# Patient Record
Sex: Female | Born: 1945 | Race: White | Hispanic: No | Smoking: Former smoker
Health system: Southern US, Community
[De-identification: ages and names within clinical notes are randomized; demographics above are authoritative.]

## PROBLEM LIST (undated history)

## (undated) DIAGNOSIS — M069 Rheumatoid arthritis, unspecified: Secondary | ICD-10-CM

## (undated) DIAGNOSIS — R42 Dizziness and giddiness: Secondary | ICD-10-CM

## (undated) DIAGNOSIS — D649 Anemia, unspecified: Secondary | ICD-10-CM

## (undated) DIAGNOSIS — K635 Polyp of colon: Secondary | ICD-10-CM

## (undated) DIAGNOSIS — I4891 Unspecified atrial fibrillation: Secondary | ICD-10-CM

## (undated) DIAGNOSIS — R5383 Other fatigue: Secondary | ICD-10-CM

## (undated) DIAGNOSIS — K449 Diaphragmatic hernia without obstruction or gangrene: Secondary | ICD-10-CM

## (undated) DIAGNOSIS — Z6820 Body mass index (BMI) 20.0-20.9, adult: Secondary | ICD-10-CM

## (undated) DIAGNOSIS — R7303 Prediabetes: Secondary | ICD-10-CM

## (undated) DIAGNOSIS — M1990 Unspecified osteoarthritis, unspecified site: Secondary | ICD-10-CM

## (undated) DIAGNOSIS — K649 Unspecified hemorrhoids: Secondary | ICD-10-CM

## (undated) HISTORY — PX: COLONOSCOPY: SHX174

## (undated) HISTORY — PX: SKIN BIOPSY: SHX1

## (undated) HISTORY — DX: Other fatigue: R53.83

## (undated) HISTORY — DX: Body mass index (BMI) 20.0-20.9, adult: Z68.20

## (undated) HISTORY — DX: Dizziness and giddiness: R42

## (undated) HISTORY — PX: FACIAL COSMETIC SURGERY: SHX629

## (undated) HISTORY — DX: Unspecified hemorrhoids: K64.9

## (undated) HISTORY — DX: Diaphragmatic hernia without obstruction or gangrene: K44.9

## (undated) HISTORY — PX: ESOPHAGOGASTRODUODENOSCOPY ENDOSCOPY: SHX5814

## (undated) HISTORY — DX: Polyp of colon: K63.5

## (undated) HISTORY — PX: CATARACT EXTRACTION, BILATERAL: SHX1313

---

## 2013-10-31 DIAGNOSIS — D229 Melanocytic nevi, unspecified: Secondary | ICD-10-CM

## 2013-10-31 HISTORY — DX: Melanocytic nevi, unspecified: D22.9

## 2019-02-10 DIAGNOSIS — R05 Cough: Secondary | ICD-10-CM | POA: Diagnosis not present

## 2019-02-10 DIAGNOSIS — R309 Painful micturition, unspecified: Secondary | ICD-10-CM | POA: Diagnosis not present

## 2019-03-17 DIAGNOSIS — E782 Mixed hyperlipidemia: Secondary | ICD-10-CM | POA: Diagnosis not present

## 2019-03-17 DIAGNOSIS — R69 Illness, unspecified: Secondary | ICD-10-CM | POA: Diagnosis not present

## 2019-04-17 DIAGNOSIS — R69 Illness, unspecified: Secondary | ICD-10-CM | POA: Diagnosis not present

## 2019-04-27 ENCOUNTER — Ambulatory Visit: Payer: Medicare HMO | Attending: Internal Medicine

## 2019-04-27 DIAGNOSIS — Z23 Encounter for immunization: Secondary | ICD-10-CM | POA: Insufficient documentation

## 2019-04-27 NOTE — Progress Notes (Signed)
Covid-19 Vaccination Clinic  Name:  Taesha Rochell    MRN: [id number] DOB: [birth year]  04/27/2019  Ms. Pamplona was observed post Covid-19 immunization for 15 minutes without incidence. She was provided with Vaccine Information Sheet and instruction to access the V-Safe system.   Ms. Gridley was instructed to call 911 with any severe reactions post vaccine: Marland Kitchen Difficulty breathing  . Swelling of your face and throat  . A fast heartbeat  . A bad rash all over your body  . Dizziness and weakness    Immunizations Administered    Name Date Dose VIS Date Route   Pfizer COVID-19 Vaccine 04/27/2019  9:01 AM 0.3 mL 03/17/2019 Intramuscular   Manufacturer: [employer], [employer]   Lot: [id number]   NDC: [address]-0

## 2019-05-18 ENCOUNTER — Ambulatory Visit: Payer: Medicare HMO | Attending: Internal Medicine

## 2019-05-18 DIAGNOSIS — Z23 Encounter for immunization: Secondary | ICD-10-CM | POA: Insufficient documentation

## 2019-05-18 NOTE — Progress Notes (Signed)
Covid-19 Vaccination Clinic  Name:  Natalie Bradshaw    MRN: [id number] DOB: 1946-01-27  05/18/2019  Natalie Bradshaw was observed post Covid-19 immunization for 15 minutes without incidence. She was provided with Vaccine Information Sheet and instruction to access the V-Safe system.   Natalie Bradshaw was instructed to call 911 with any severe reactions post vaccine: Marland Kitchen Difficulty breathing  . Swelling of your face and throat  . A fast heartbeat  . A bad rash all over your body  . Dizziness and weakness    Immunizations Administered    Name Date Dose VIS Date Route   Pfizer COVID-19 Vaccine 05/18/2019  8:44 AM 0.3 mL 03/17/2019 Intramuscular   Manufacturer: [employer], [employer]   Lot: [id number]   NDC: [address]-2

## 2019-05-29 DIAGNOSIS — H40012 Open angle with borderline findings, low risk, left eye: Secondary | ICD-10-CM | POA: Diagnosis not present

## 2019-05-29 DIAGNOSIS — H2513 Age-related nuclear cataract, bilateral: Secondary | ICD-10-CM | POA: Diagnosis not present

## 2019-06-01 DIAGNOSIS — R69 Illness, unspecified: Secondary | ICD-10-CM | POA: Diagnosis not present

## 2019-06-12 DIAGNOSIS — H25043 Posterior subcapsular polar age-related cataract, bilateral: Secondary | ICD-10-CM | POA: Diagnosis not present

## 2019-06-12 DIAGNOSIS — H25013 Cortical age-related cataract, bilateral: Secondary | ICD-10-CM | POA: Diagnosis not present

## 2019-06-12 DIAGNOSIS — H2511 Age-related nuclear cataract, right eye: Secondary | ICD-10-CM | POA: Diagnosis not present

## 2019-06-12 DIAGNOSIS — H2513 Age-related nuclear cataract, bilateral: Secondary | ICD-10-CM | POA: Diagnosis not present

## 2019-06-14 DIAGNOSIS — N898 Other specified noninflammatory disorders of vagina: Secondary | ICD-10-CM | POA: Diagnosis not present

## 2019-06-14 DIAGNOSIS — N950 Postmenopausal bleeding: Secondary | ICD-10-CM | POA: Diagnosis not present

## 2019-06-14 DIAGNOSIS — R102 Pelvic and perineal pain: Secondary | ICD-10-CM | POA: Diagnosis not present

## 2019-06-14 DIAGNOSIS — R3915 Urgency of urination: Secondary | ICD-10-CM | POA: Diagnosis not present

## 2019-06-14 DIAGNOSIS — R351 Nocturia: Secondary | ICD-10-CM | POA: Diagnosis not present

## 2019-06-20 ENCOUNTER — Encounter: Payer: Self-pay | Admitting: Physician Assistant

## 2019-06-20 ENCOUNTER — Ambulatory Visit: Payer: Medicare HMO | Admitting: Physician Assistant

## 2019-06-20 ENCOUNTER — Other Ambulatory Visit: Payer: Self-pay

## 2019-06-20 DIAGNOSIS — L570 Actinic keratosis: Secondary | ICD-10-CM | POA: Diagnosis not present

## 2019-06-20 DIAGNOSIS — Z1283 Encounter for screening for malignant neoplasm of skin: Secondary | ICD-10-CM | POA: Diagnosis not present

## 2019-06-20 DIAGNOSIS — D485 Neoplasm of uncertain behavior of skin: Secondary | ICD-10-CM | POA: Diagnosis not present

## 2019-06-20 DIAGNOSIS — Z85828 Personal history of other malignant neoplasm of skin: Secondary | ICD-10-CM | POA: Diagnosis not present

## 2019-06-20 DIAGNOSIS — D489 Neoplasm of uncertain behavior, unspecified: Secondary | ICD-10-CM

## 2019-06-20 NOTE — Progress Notes (Signed)
   New Patient Visit  Subjective  Natalie Bradshaw is a 74 y.o. female who presents for the following: Annual Exam (pt has histoy of skin cancer previous derm in [place]. pt does fluorouracil tx every year on chest and face.  ).  Objective  Well appearing patient in no apparent distress; mood and affect are within normal limits.  A full examination was performed including scalp, head, eyes, ears, nose, lips, neck, chest, axillae, abdomen, back, buttocks, bilateral upper extremities, bilateral lower extremities, hands, feet, fingers, toes, fingernails, and toenails. All findings within normal limits unless otherwise noted below.  Objective  Left Thigh - Posterior: Erythematous patches with gritty scale.  Objective  Dorsum of Nose: Divot in erythematous base.  Objective  Left chest: Bichromic macule.  Assessment & Plan  AK (actinic keratosis) Left Thigh - Posterior  Destruction of lesion - Left Thigh - Posterior Complexity: simple   Destruction method: cryotherapy   Informed consent: discussed and consent obtained   Timeout:  patient name, date of birth, surgical site, and procedure verified Lesion destroyed using liquid nitrogen: Yes   Cryotherapy cycles:  3 Hemostasis achieved with:  Gelfoam Outcome: patient tolerated procedure well with no complications   Post-procedure details: wound care instructions given    Neoplasm of uncertain behavior (2) Dorsum of Nose  Left chest  Punch biopsy at a later date due to pending surgery next week

## 2019-06-20 NOTE — Patient Instructions (Signed)

## 2019-06-22 DIAGNOSIS — R102 Pelvic and perineal pain: Secondary | ICD-10-CM | POA: Diagnosis not present

## 2019-06-22 DIAGNOSIS — N950 Postmenopausal bleeding: Secondary | ICD-10-CM | POA: Diagnosis not present

## 2019-06-22 DIAGNOSIS — N83202 Unspecified ovarian cyst, left side: Secondary | ICD-10-CM | POA: Diagnosis not present

## 2019-06-27 DIAGNOSIS — H25042 Posterior subcapsular polar age-related cataract, left eye: Secondary | ICD-10-CM | POA: Diagnosis not present

## 2019-06-27 DIAGNOSIS — H25811 Combined forms of age-related cataract, right eye: Secondary | ICD-10-CM | POA: Diagnosis not present

## 2019-06-27 DIAGNOSIS — H25012 Cortical age-related cataract, left eye: Secondary | ICD-10-CM | POA: Diagnosis not present

## 2019-06-27 DIAGNOSIS — H2511 Age-related nuclear cataract, right eye: Secondary | ICD-10-CM | POA: Diagnosis not present

## 2019-06-27 DIAGNOSIS — H2512 Age-related nuclear cataract, left eye: Secondary | ICD-10-CM | POA: Diagnosis not present

## 2019-06-27 DIAGNOSIS — H2513 Age-related nuclear cataract, bilateral: Secondary | ICD-10-CM | POA: Diagnosis not present

## 2019-07-11 DIAGNOSIS — H2512 Age-related nuclear cataract, left eye: Secondary | ICD-10-CM | POA: Diagnosis not present

## 2019-07-11 DIAGNOSIS — H2513 Age-related nuclear cataract, bilateral: Secondary | ICD-10-CM | POA: Diagnosis not present

## 2019-07-17 DIAGNOSIS — R600 Localized edema: Secondary | ICD-10-CM | POA: Diagnosis not present

## 2019-07-17 DIAGNOSIS — M2550 Pain in unspecified joint: Secondary | ICD-10-CM | POA: Diagnosis not present

## 2019-07-28 ENCOUNTER — Ambulatory Visit: Payer: Medicare HMO | Admitting: Physician Assistant

## 2019-08-03 ENCOUNTER — Encounter: Payer: Self-pay | Admitting: *Deleted

## 2019-08-09 DIAGNOSIS — R69 Illness, unspecified: Secondary | ICD-10-CM | POA: Diagnosis not present

## 2019-08-09 DIAGNOSIS — M2550 Pain in unspecified joint: Secondary | ICD-10-CM | POA: Diagnosis not present

## 2019-08-09 DIAGNOSIS — M1990 Unspecified osteoarthritis, unspecified site: Secondary | ICD-10-CM | POA: Diagnosis not present

## 2019-08-09 DIAGNOSIS — E663 Overweight: Secondary | ICD-10-CM | POA: Diagnosis not present

## 2019-08-09 DIAGNOSIS — M7989 Other specified soft tissue disorders: Secondary | ICD-10-CM | POA: Diagnosis not present

## 2019-08-09 DIAGNOSIS — Z6827 Body mass index (BMI) 27.0-27.9, adult: Secondary | ICD-10-CM | POA: Diagnosis not present

## 2019-08-16 DIAGNOSIS — M06041 Rheumatoid arthritis without rheumatoid factor, right hand: Secondary | ICD-10-CM | POA: Diagnosis not present

## 2019-08-16 DIAGNOSIS — M1990 Unspecified osteoarthritis, unspecified site: Secondary | ICD-10-CM | POA: Diagnosis not present

## 2019-08-16 DIAGNOSIS — Z6827 Body mass index (BMI) 27.0-27.9, adult: Secondary | ICD-10-CM | POA: Diagnosis not present

## 2019-08-16 DIAGNOSIS — E663 Overweight: Secondary | ICD-10-CM | POA: Diagnosis not present

## 2019-08-16 DIAGNOSIS — R69 Illness, unspecified: Secondary | ICD-10-CM | POA: Diagnosis not present

## 2019-08-16 DIAGNOSIS — M06042 Rheumatoid arthritis without rheumatoid factor, left hand: Secondary | ICD-10-CM | POA: Diagnosis not present

## 2019-08-16 DIAGNOSIS — M2550 Pain in unspecified joint: Secondary | ICD-10-CM | POA: Diagnosis not present

## 2019-08-16 DIAGNOSIS — M7989 Other specified soft tissue disorders: Secondary | ICD-10-CM | POA: Diagnosis not present

## 2019-08-22 DIAGNOSIS — M0520 Rheumatoid vasculitis with rheumatoid arthritis of unspecified site: Secondary | ICD-10-CM | POA: Diagnosis not present

## 2019-08-22 DIAGNOSIS — R350 Frequency of micturition: Secondary | ICD-10-CM | POA: Diagnosis not present

## 2019-08-24 ENCOUNTER — Ambulatory Visit: Payer: Medicare HMO | Admitting: Physician Assistant

## 2019-09-18 DIAGNOSIS — M5431 Sciatica, right side: Secondary | ICD-10-CM | POA: Diagnosis not present

## 2019-09-18 DIAGNOSIS — M9903 Segmental and somatic dysfunction of lumbar region: Secondary | ICD-10-CM | POA: Diagnosis not present

## 2019-09-19 DIAGNOSIS — M9903 Segmental and somatic dysfunction of lumbar region: Secondary | ICD-10-CM | POA: Diagnosis not present

## 2019-09-19 DIAGNOSIS — M5431 Sciatica, right side: Secondary | ICD-10-CM | POA: Diagnosis not present

## 2019-09-20 DIAGNOSIS — M9903 Segmental and somatic dysfunction of lumbar region: Secondary | ICD-10-CM | POA: Diagnosis not present

## 2019-09-20 DIAGNOSIS — M5431 Sciatica, right side: Secondary | ICD-10-CM | POA: Diagnosis not present

## 2019-09-21 DIAGNOSIS — M5431 Sciatica, right side: Secondary | ICD-10-CM | POA: Diagnosis not present

## 2019-09-21 DIAGNOSIS — M9903 Segmental and somatic dysfunction of lumbar region: Secondary | ICD-10-CM | POA: Diagnosis not present

## 2019-09-25 DIAGNOSIS — M5431 Sciatica, right side: Secondary | ICD-10-CM | POA: Diagnosis not present

## 2019-09-25 DIAGNOSIS — M9903 Segmental and somatic dysfunction of lumbar region: Secondary | ICD-10-CM | POA: Diagnosis not present

## 2019-09-27 DIAGNOSIS — M5431 Sciatica, right side: Secondary | ICD-10-CM | POA: Diagnosis not present

## 2019-09-27 DIAGNOSIS — M9903 Segmental and somatic dysfunction of lumbar region: Secondary | ICD-10-CM | POA: Diagnosis not present

## 2019-10-02 DIAGNOSIS — M9903 Segmental and somatic dysfunction of lumbar region: Secondary | ICD-10-CM | POA: Diagnosis not present

## 2019-10-02 DIAGNOSIS — M5431 Sciatica, right side: Secondary | ICD-10-CM | POA: Diagnosis not present

## 2019-10-05 DIAGNOSIS — R69 Illness, unspecified: Secondary | ICD-10-CM | POA: Diagnosis not present

## 2019-10-05 DIAGNOSIS — M5431 Sciatica, right side: Secondary | ICD-10-CM | POA: Diagnosis not present

## 2019-10-05 DIAGNOSIS — M9903 Segmental and somatic dysfunction of lumbar region: Secondary | ICD-10-CM | POA: Diagnosis not present

## 2019-10-09 DIAGNOSIS — M9903 Segmental and somatic dysfunction of lumbar region: Secondary | ICD-10-CM | POA: Diagnosis not present

## 2019-10-09 DIAGNOSIS — M5431 Sciatica, right side: Secondary | ICD-10-CM | POA: Diagnosis not present

## 2019-10-23 DIAGNOSIS — M9903 Segmental and somatic dysfunction of lumbar region: Secondary | ICD-10-CM | POA: Diagnosis not present

## 2019-10-23 DIAGNOSIS — M5431 Sciatica, right side: Secondary | ICD-10-CM | POA: Diagnosis not present

## 2019-10-25 DIAGNOSIS — M06042 Rheumatoid arthritis without rheumatoid factor, left hand: Secondary | ICD-10-CM | POA: Diagnosis not present

## 2019-10-25 DIAGNOSIS — R69 Illness, unspecified: Secondary | ICD-10-CM | POA: Diagnosis not present

## 2019-10-25 DIAGNOSIS — M06041 Rheumatoid arthritis without rheumatoid factor, right hand: Secondary | ICD-10-CM | POA: Diagnosis not present

## 2019-10-25 DIAGNOSIS — E663 Overweight: Secondary | ICD-10-CM | POA: Diagnosis not present

## 2019-10-25 DIAGNOSIS — M2550 Pain in unspecified joint: Secondary | ICD-10-CM | POA: Diagnosis not present

## 2019-10-25 DIAGNOSIS — M1990 Unspecified osteoarthritis, unspecified site: Secondary | ICD-10-CM | POA: Diagnosis not present

## 2019-10-25 DIAGNOSIS — Z6826 Body mass index (BMI) 26.0-26.9, adult: Secondary | ICD-10-CM | POA: Diagnosis not present

## 2019-10-25 DIAGNOSIS — M7989 Other specified soft tissue disorders: Secondary | ICD-10-CM | POA: Diagnosis not present

## 2019-11-08 DIAGNOSIS — M9903 Segmental and somatic dysfunction of lumbar region: Secondary | ICD-10-CM | POA: Diagnosis not present

## 2019-11-08 DIAGNOSIS — M5431 Sciatica, right side: Secondary | ICD-10-CM | POA: Diagnosis not present

## 2019-11-10 ENCOUNTER — Ambulatory Visit: Payer: Medicare HMO | Admitting: Physician Assistant

## 2019-11-16 DIAGNOSIS — Z5181 Encounter for therapeutic drug level monitoring: Secondary | ICD-10-CM | POA: Diagnosis not present

## 2019-11-16 DIAGNOSIS — E2839 Other primary ovarian failure: Secondary | ICD-10-CM | POA: Diagnosis not present

## 2019-11-16 DIAGNOSIS — E782 Mixed hyperlipidemia: Secondary | ICD-10-CM | POA: Diagnosis not present

## 2019-11-16 DIAGNOSIS — R69 Illness, unspecified: Secondary | ICD-10-CM | POA: Diagnosis not present

## 2019-11-16 DIAGNOSIS — R7301 Impaired fasting glucose: Secondary | ICD-10-CM | POA: Diagnosis not present

## 2019-11-16 DIAGNOSIS — Z1211 Encounter for screening for malignant neoplasm of colon: Secondary | ICD-10-CM | POA: Diagnosis not present

## 2019-11-16 DIAGNOSIS — Z0000 Encounter for general adult medical examination without abnormal findings: Secondary | ICD-10-CM | POA: Diagnosis not present

## 2019-11-17 ENCOUNTER — Other Ambulatory Visit: Payer: Self-pay | Admitting: Family Medicine

## 2019-11-17 DIAGNOSIS — E2839 Other primary ovarian failure: Secondary | ICD-10-CM

## 2019-11-21 DIAGNOSIS — M5431 Sciatica, right side: Secondary | ICD-10-CM | POA: Diagnosis not present

## 2019-11-21 DIAGNOSIS — M9903 Segmental and somatic dysfunction of lumbar region: Secondary | ICD-10-CM | POA: Diagnosis not present

## 2019-11-29 DIAGNOSIS — Z1211 Encounter for screening for malignant neoplasm of colon: Secondary | ICD-10-CM | POA: Diagnosis not present

## 2019-12-19 DIAGNOSIS — M9903 Segmental and somatic dysfunction of lumbar region: Secondary | ICD-10-CM | POA: Diagnosis not present

## 2019-12-19 DIAGNOSIS — M5431 Sciatica, right side: Secondary | ICD-10-CM | POA: Diagnosis not present

## 2019-12-20 DIAGNOSIS — N83202 Unspecified ovarian cyst, left side: Secondary | ICD-10-CM | POA: Diagnosis not present

## 2019-12-26 DIAGNOSIS — M7989 Other specified soft tissue disorders: Secondary | ICD-10-CM | POA: Diagnosis not present

## 2019-12-26 DIAGNOSIS — H40012 Open angle with borderline findings, low risk, left eye: Secondary | ICD-10-CM | POA: Diagnosis not present

## 2019-12-26 DIAGNOSIS — Z6826 Body mass index (BMI) 26.0-26.9, adult: Secondary | ICD-10-CM | POA: Diagnosis not present

## 2019-12-26 DIAGNOSIS — M2550 Pain in unspecified joint: Secondary | ICD-10-CM | POA: Diagnosis not present

## 2019-12-26 DIAGNOSIS — R69 Illness, unspecified: Secondary | ICD-10-CM | POA: Diagnosis not present

## 2019-12-26 DIAGNOSIS — E663 Overweight: Secondary | ICD-10-CM | POA: Diagnosis not present

## 2019-12-26 DIAGNOSIS — M1990 Unspecified osteoarthritis, unspecified site: Secondary | ICD-10-CM | POA: Diagnosis not present

## 2019-12-26 DIAGNOSIS — M06041 Rheumatoid arthritis without rheumatoid factor, right hand: Secondary | ICD-10-CM | POA: Diagnosis not present

## 2019-12-26 DIAGNOSIS — M06042 Rheumatoid arthritis without rheumatoid factor, left hand: Secondary | ICD-10-CM | POA: Diagnosis not present

## 2020-01-03 DIAGNOSIS — M5431 Sciatica, right side: Secondary | ICD-10-CM | POA: Diagnosis not present

## 2020-01-03 DIAGNOSIS — M9903 Segmental and somatic dysfunction of lumbar region: Secondary | ICD-10-CM | POA: Diagnosis not present

## 2020-01-08 DIAGNOSIS — R69 Illness, unspecified: Secondary | ICD-10-CM | POA: Diagnosis not present

## 2020-01-11 DIAGNOSIS — M9903 Segmental and somatic dysfunction of lumbar region: Secondary | ICD-10-CM | POA: Diagnosis not present

## 2020-01-11 DIAGNOSIS — R69 Illness, unspecified: Secondary | ICD-10-CM | POA: Diagnosis not present

## 2020-01-11 DIAGNOSIS — M5431 Sciatica, right side: Secondary | ICD-10-CM | POA: Diagnosis not present

## 2020-01-23 DIAGNOSIS — R69 Illness, unspecified: Secondary | ICD-10-CM | POA: Diagnosis not present

## 2020-01-23 DIAGNOSIS — E782 Mixed hyperlipidemia: Secondary | ICD-10-CM | POA: Diagnosis not present

## 2020-01-23 DIAGNOSIS — R2689 Other abnormalities of gait and mobility: Secondary | ICD-10-CM | POA: Diagnosis not present

## 2020-01-23 DIAGNOSIS — F5101 Primary insomnia: Secondary | ICD-10-CM | POA: Diagnosis not present

## 2020-01-31 ENCOUNTER — Ambulatory Visit: Payer: Medicare HMO | Admitting: Physician Assistant

## 2020-01-31 ENCOUNTER — Ambulatory Visit: Payer: Medicare HMO | Admitting: Dermatology

## 2020-01-31 ENCOUNTER — Encounter: Payer: Self-pay | Admitting: Dermatology

## 2020-01-31 ENCOUNTER — Other Ambulatory Visit: Payer: Self-pay

## 2020-01-31 DIAGNOSIS — L570 Actinic keratosis: Secondary | ICD-10-CM

## 2020-01-31 DIAGNOSIS — C44311 Basal cell carcinoma of skin of nose: Secondary | ICD-10-CM | POA: Diagnosis not present

## 2020-01-31 DIAGNOSIS — B079 Viral wart, unspecified: Secondary | ICD-10-CM

## 2020-01-31 DIAGNOSIS — D485 Neoplasm of uncertain behavior of skin: Secondary | ICD-10-CM

## 2020-01-31 DIAGNOSIS — L988 Other specified disorders of the skin and subcutaneous tissue: Secondary | ICD-10-CM

## 2020-01-31 DIAGNOSIS — C4491 Basal cell carcinoma of skin, unspecified: Secondary | ICD-10-CM

## 2020-01-31 HISTORY — DX: Basal cell carcinoma of skin, unspecified: C44.91

## 2020-01-31 MED ORDER — TRETINOIN 0.1 % EX CREA: TOPICAL_CREAM | Freq: Every evening | CUTANEOUS | 3 refills | Status: AC

## 2020-01-31 NOTE — Patient Instructions (Signed)

## 2020-02-01 ENCOUNTER — Telehealth: Payer: Self-pay

## 2020-02-01 DIAGNOSIS — M9903 Segmental and somatic dysfunction of lumbar region: Secondary | ICD-10-CM | POA: Diagnosis not present

## 2020-02-01 DIAGNOSIS — M5431 Sciatica, right side: Secondary | ICD-10-CM | POA: Diagnosis not present

## 2020-02-05 DIAGNOSIS — R69 Illness, unspecified: Secondary | ICD-10-CM | POA: Diagnosis not present

## 2020-02-08 ENCOUNTER — Telehealth: Payer: Self-pay

## 2020-02-08 NOTE — Telephone Encounter (Signed)
-----   Message from Warren Danes, [place] sent at 02/08/2020 12:58 PM EDT ----- Pt needs 30 minute surgery

## 2020-02-08 NOTE — Telephone Encounter (Signed)
Phone call to patient with her pathology results. Voicemail left for patient to give the office a call back.  

## 2020-02-08 NOTE — Progress Notes (Addendum)
I, Lavonna Monarch, MD, have reviewed all documentation for this visit. The documentation on 11/03/20 for the exam, diagnosis, procedures, and orders are all accurate and complete. I, Lavonna Monarch, MD, have reviewed all documentation for this visit. The documentation on 11/03/20 for the exam, diagnosis, procedures, and orders are all accurate and complete.   Follow-Up Visit   Subjective  Natalie Bradshaw is a 74 y.o. female who presents for the following: Skin Problem (tip of nose- biopsy- deffered last office visit).   The following portions of the chart were reviewed this encounter and updated as appropriate:     Objective  Well appearing patient in no apparent distress; mood and affect are within normal limits.  All skin waist up examined.  Objective  Left Lower Leg - Anterior, Left Lower Leg - Posterior, Right Lower Leg - Posterior: Verrucous papules -- Discussed viral etiology and contagion.   Objective  Mid Tip of Nose: Pink raised papule      Assessment & Plan  Viral warts, unspecified type (3) Left Lower Leg - Anterior; Left Lower Leg - Posterior; Right Lower Leg - Posterior  Destruction of lesion - Left Lower Leg - Anterior, Left Lower Leg - Posterior, Right Lower Leg - Posterior Complexity: simple   Destruction method: cryotherapy   Informed consent: discussed and consent obtained   Timeout:  patient name, date of birth, surgical site, and procedure verified Lesion destroyed using liquid nitrogen: Yes   Cryotherapy cycles:  5 Outcome: patient tolerated procedure well with no complications    Wrinkles  Ordered Medications: tretinoin (RETIN-A) 0.1 % cream  AK (actinic keratosis)  Ordered Medications: tretinoin (RETIN-A) 0.1 % cream  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 Mid Tip of Nose  Skin / nail biopsy Type of biopsy: tangential   Informed consent: discussed and consent obtained   Timeout: patient name, date of birth, surgical site, and  procedure verified   Procedure prep:  Patient was prepped and draped in usual sterile fashion (Non sterile) Prep type:  Chlorhexidine Anesthesia: the lesion was anesthetized in a standard fashion   Anesthetic:  1% lidocaine w/ epinephrine 1-100,000 local infiltration Instrument used: flexible razor blade   Outcome: patient tolerated procedure well   Post-procedure details: wound care instructions given    Specimen 1 - Surgical pathology Differential Diagnosis: bcc vs scc Check Margins: No    I, Karel Mowers, PA-C, have reviewed all documentation's for this visit.  The documentation on 02/08/20 for the exam, diagnosis, procedures and orders are all accurate and complete.

## 2020-02-13 ENCOUNTER — Telehealth: Payer: Self-pay | Admitting: Dermatology

## 2020-02-13 NOTE — Telephone Encounter (Signed)
Results. Can leave message

## 2020-02-13 NOTE — Telephone Encounter (Signed)
Phone call to patient with her pathology results voicemail left for patient to give the office a call back.  °

## 2020-02-14 NOTE — Telephone Encounter (Signed)
Pathology to patient, 30 minute surgery scheduled.  

## 2020-02-15 DIAGNOSIS — M9903 Segmental and somatic dysfunction of lumbar region: Secondary | ICD-10-CM | POA: Diagnosis not present

## 2020-02-15 DIAGNOSIS — M5431 Sciatica, right side: Secondary | ICD-10-CM | POA: Diagnosis not present

## 2020-02-22 ENCOUNTER — Other Ambulatory Visit: Payer: Self-pay

## 2020-02-22 ENCOUNTER — Ambulatory Visit
Admission: RE | Admit: 2020-02-22 | Discharge: 2020-02-22 | Disposition: A | Discharge status: Home / Self Care | Payer: Medicare HMO | Source: Ambulatory Visit | Attending: Family Medicine | Admitting: Family Medicine

## 2020-02-22 DIAGNOSIS — Z780 Asymptomatic menopausal state: Secondary | ICD-10-CM | POA: Diagnosis not present

## 2020-02-22 DIAGNOSIS — E2839 Other primary ovarian failure: Secondary | ICD-10-CM

## 2020-02-27 DIAGNOSIS — Z6828 Body mass index (BMI) 28.0-28.9, adult: Secondary | ICD-10-CM | POA: Diagnosis not present

## 2020-02-27 DIAGNOSIS — M06042 Rheumatoid arthritis without rheumatoid factor, left hand: Secondary | ICD-10-CM | POA: Diagnosis not present

## 2020-02-27 DIAGNOSIS — E663 Overweight: Secondary | ICD-10-CM | POA: Diagnosis not present

## 2020-02-27 DIAGNOSIS — M1990 Unspecified osteoarthritis, unspecified site: Secondary | ICD-10-CM | POA: Diagnosis not present

## 2020-02-27 DIAGNOSIS — M7989 Other specified soft tissue disorders: Secondary | ICD-10-CM | POA: Diagnosis not present

## 2020-02-27 DIAGNOSIS — M06041 Rheumatoid arthritis without rheumatoid factor, right hand: Secondary | ICD-10-CM | POA: Diagnosis not present

## 2020-02-27 DIAGNOSIS — M2550 Pain in unspecified joint: Secondary | ICD-10-CM | POA: Diagnosis not present

## 2020-02-27 DIAGNOSIS — R69 Illness, unspecified: Secondary | ICD-10-CM | POA: Diagnosis not present

## 2020-03-13 ENCOUNTER — Encounter: Payer: Self-pay | Admitting: Physical Therapy

## 2020-03-13 ENCOUNTER — Ambulatory Visit: Payer: Medicare HMO | Attending: Family Medicine | Admitting: Physical Therapy

## 2020-03-13 ENCOUNTER — Other Ambulatory Visit: Payer: Self-pay

## 2020-03-13 DIAGNOSIS — R278 Other lack of coordination: Secondary | ICD-10-CM | POA: Diagnosis not present

## 2020-03-13 NOTE — Therapy (Signed)
[facility] [address] [place], [place], [address] Phone: [phone]   Fax:  [phone]  Physical Therapy Evaluation  Patient Details  Name: Natalie Bradshaw MRN: [id number] Date of Birth: 1945/07/29 Referring Provider (PT): R26.89 (ICD-10-CM) - Balance problems   Encounter Date: 03/13/2020   PT End of Session - 03/13/20 1054    Visit Number 1    Number of Visits 1    Authorization Type [employer] Medicare    PT Start Time 1017    PT Stop Time 1050    PT Time Calculation (min) 33 min    Activity Tolerance Patient tolerated treatment well    Behavior During Therapy [facility] for tasks assessed/performed           Past Medical History:  Diagnosis Date  . Atypical mole 10/31/2013   moderate- midline,chest-margins clear  . Superficial basal cell carcinoma (BCC) 01/31/2020   Mid tip of nose    Past Surgical History:  Procedure Laterality Date  . SKIN BIOPSY      There were no vitals filed for this visit.    Subjective Assessment - 03/13/20 1015    Subjective Pt referred to OPPT for balance problems.  Pt reports she lost her son suddenly approx 7 mos ago and has had signif grief since then.  She has increased light-headedness and anxiety since losing her son.  She feels as though she will pass out due to anxiety when around too much stimulus.  She has increased anxiety when out in the community.    Pertinent History negative bone density    How long can you walk comfortably? unlimited    Patient Stated Goals feel better without anxiety pills    Currently in Pain? No/denies              [facility] PT Assessment - 03/13/20 0001      Assessment   Medical Diagnosis Glenis Smoker, MD    Referring Provider (PT) R26.89 (ICD-10-CM) - Balance problems    Onset Date/Surgical Date --   7 mos ago after losing her son   Hand Dominance Right    Next MD Visit next week    Prior Therapy no      Precautions    Precautions None      Restrictions   Weight Bearing Restrictions No      Balance Screen   Has the patient fallen in the past 6 months No    Has the patient had a decrease in activity level because of a fear of falling?  No    Is the patient reluctant to leave their home because of a fear of falling?  No      Prior Function   Level of Independence Independent      Cognition   Overall Cognitive Status Within Functional Limits for tasks assessed      Functional Tests   Functional tests Squat;Single leg stance      Squat   Comments ind      Single Leg Stance   Comments able to safely balance on Rt/Lt LE x 10 sec      ROM / Strength   AROM / PROM / Strength AROM;Strength      AROM   Overall AROM Comments trunk and LEs WNL      Strength   Overall Strength Comments 5/5 bil LEs      Ambulation/Gait   Gait Comments gait pattern WNL  for trunk and LEs, and scored full scores on both Berg Balance Scale and Dynamic Gait Index demonstrating no/low fall  risk.  PT discussed importance of returning to walking and yoga and seeking some grief counseling to help her cope with the loss of her son given its impact on her physical state and tolerance of activity.  PT gave info on vagus nerve stimulation activities including meditation, yoga, and walking.  PT and Pt agreed that she could seek her physical activities previously enjoyed independently and did not need follow up PT visits, especially given no fall risk found with evaluation.    Personal Factors and Comorbidities Comorbidity 1    Comorbidities grief over sudden loss of son    Examination-Participation Restrictions Community Activity    Stability/Clinical Decision Making Stable/Uncomplicated    Clinical Decision Making Low    Rehab Potential Excellent    PT Frequency One time visit    PT Duration Other (comment)   evaluation only   PT Treatment/Interventions Patient/family education    PT Next Visit Plan eval only with self-care instructed at evaluation    PT Home Exercise Plan return to walking, yoga, other stress management techniques previously enjoyed, know your triggers for stress/anxiety and try brief exposures as tol    Consulted and Agree with Plan of Care Patient           Patient will benefit from skilled therapeutic intervention in order to improve the following deficits and impairments:     Visit Diagnosis: Other lack of coordination - Plan: PT plan of care cert/re-cert     Problem List There are no problems to display for this patient.   Venetia Night Sanora Cunanan, PT 03/13/20 12:51 PM   [facility] [address] [place], [place], [address] Phone: [phone]   Fax:  [phone]  Name: Natalie Bradshaw MRN: [id number] Date of Birth: 1946-02-15  [facility] [address] [place], [place], [address] Phone: [phone]   Fax:  [phone]  Physical Therapy Evaluation  Patient Details  Name: Natalie Bradshaw MRN: [id number] Date of Birth: 1945/07/29 Referring Provider (PT): R26.89 (ICD-10-CM) - Balance problems   Encounter Date: 03/13/2020   PT End of Session - 03/13/20 1054    Visit Number 1    Number of Visits 1    Authorization Type A[employer]na Medicare    PT Start Time 1017    PT Stop Time 1050    PT Time Calculation (min) 33 min    Activity Tolerance Patient tolerated treatment well    Behavior During Therapy [facility] for tasks assessed/performed           Past Medical History:  Diagnosis Date  . Atypical mole 10/31/2013   moderate- midline,chest-margins clear  . Superficial basal cell carcinoma (BCC) 01/31/2020   Mid tip of nose    Past Surgical History:  Procedure Laterality Date  . SKIN BIOPSY      There were no vitals filed for this visit.    Subjective Assessment - 03/13/20 1015    Subjective Pt referred to OPPT for balance problems.  Pt reports she lost her son suddenly approx 7 mos ago and has had signif grief since then.  She has increased light-headedness and anxiety since losing her son.  She feels as though she will pass out due to anxiety when around too much stimulus.  She has increased anxiety when out in the community.    Pertinent History negative bone density    How long can you walk comfortably? unlimited    Patient Stated Goals feel better without anxiety pills    Currently in Pain? No/denies              [facility] PT Assessment - 03/13/20 0001      Assessment   Medical Diagnosis Glenis Smoker, MD    Referring Provider (PT) R26.89 (ICD-10-CM) - Balance problems    Onset Date/Surgical Date --   7 mos ago after losing her son   Hand Dominance Right    Next MD Visit next week    Prior Therapy no      Precautions    Precautions None      Restrictions   Weight Bearing Restrictions No      Balance Screen   Has the patient fallen in the past 6 months No    Has the patient had a decrease in activity level because of a fear of falling?  No    Is the patient reluctant to leave their home because of a fear of falling?  No      Prior Function   Level of Independence Independent      Cognition   Overall Cognitive Status Within Functional Limits for tasks assessed      Functional Tests   Functional tests Squat;Single leg stance      Squat   Comments ind      Single Leg Stance   Comments able to safely balance on Rt/Lt LE x 10 sec      ROM / Strength   AROM / PROM / Strength AROM;Strength      AROM   Overall AROM Comments trunk and LEs WNL      Strength   Overall Strength Comments 5/5 bil LEs      Ambulation/Gait   Gait Comments gait pattern WNL

## 2020-03-13 NOTE — Patient Instructions (Signed)
Stress Management and Relaxation Techniques There are two divisions in the nervous system that run many of our body's "behind the scenes" functions.  The "fight or flight" nervous system, and the "calming, rest and digest" nervous system.  These two systems have opposite effects on our body organs and systems and can impact our heart rate, blood pressure, breath rate, temperature, GI function, and experience of stress or calm.    Taking time to stimulate the "calming, rest and digest" part of our nervous system can help reduce experience of symptoms of chronic pain conditions, decrease stress and anxiety, and allow us to feel more equipped to handle challenges.  Below are strategies, techniques, and video suggestions to help stimulate this calming system.     Ways to Calm the Nervous System Yoga Meditation Mindfulness  Stretching Exercise Deep, slow breathing into belly (diaphragmatic breathing) with focused prolonged exhale Monotasking vs Multi-tasking (do one activity daily that is simple, focused, and slowly performed) Listen to your biorhythms: sleep when tired, rise when rested, eat when hungry, stop when full, etc Social connections - seek connections with others Laughter - laughing helps stimulate our "calming" nervous system Massage - by a practitioner or self-massage (example, feet for reflexology points) Singing or humming Cold exposure - try 30 sec of cold water at the end of your shower   Meditation, Yoga, Breathing, Stretching Video Suggestions [employer] YouTube Videos: Guided Meditation for Pelvic Floor Relaxation - [employer] YouTube video 10-Min Breath Meditation for Pelvic Health and Healing - [employer] YouTube video Pelvic Floor Release Stretches Bedtime Yoga for Pelvic Tension Pelvic Floor Release and Inner Thigh Stretch - Yoga for Pelvic Health (approx. 40 min) Progressive Muscle Relaxation Exercises - search Edmond Jacobson exercise videos on  YouTube Focused relaxation through guided relaxation, breathing, and contractions/relaxations of various muscle groups Autogenic Relaxation Technique - search videos on YouTube Uses body's natural relaxation response through guided meditation, inducing heaviness in body, and verbal stimuli/affirmations to create overall feeling of well-being, slowed breathing, reduced heart rate, reduced blood pressure, reduced stress/anxiety Sympathetic Breathing Meditation - search videos on YouTube Regulate the nervous system and restore calm through focused breathing to stimulate the parasympathetic nervous system (the opposite of our "fight or flight" sympathetic nervous system) Mindfulness Meditation - search videos on YouTube Focuses on choosing to be present in the moment, finding enjoyment in the now Diaphragmatic Breathing - search videos on YouTube Guided Imagery for Relaxation - search videos on YouTube  

## 2020-03-19 DIAGNOSIS — R69 Illness, unspecified: Secondary | ICD-10-CM | POA: Diagnosis not present

## 2020-03-19 DIAGNOSIS — F5101 Primary insomnia: Secondary | ICD-10-CM | POA: Diagnosis not present

## 2020-03-20 ENCOUNTER — Ambulatory Visit: Payer: Medicare HMO | Admitting: Physical Therapy

## 2020-04-09 DIAGNOSIS — J040 Acute laryngitis: Secondary | ICD-10-CM | POA: Diagnosis not present

## 2020-04-25 ENCOUNTER — Encounter: Payer: Self-pay | Admitting: Dermatology

## 2020-04-25 ENCOUNTER — Other Ambulatory Visit: Payer: Self-pay

## 2020-04-25 ENCOUNTER — Ambulatory Visit (INDEPENDENT_AMBULATORY_CARE_PROVIDER_SITE_OTHER): Payer: Medicare HMO | Admitting: Dermatology

## 2020-04-25 DIAGNOSIS — C44311 Basal cell carcinoma of skin of nose: Secondary | ICD-10-CM | POA: Diagnosis not present

## 2020-04-25 DIAGNOSIS — M71342 Other bursal cyst, left hand: Secondary | ICD-10-CM

## 2020-04-25 DIAGNOSIS — M7130 Other bursal cyst, unspecified site: Secondary | ICD-10-CM

## 2020-04-25 DIAGNOSIS — C4491 Basal cell carcinoma of skin, unspecified: Secondary | ICD-10-CM

## 2020-04-25 NOTE — Patient Instructions (Signed)

## 2020-04-29 ENCOUNTER — Encounter: Payer: Self-pay | Admitting: Dermatology

## 2020-04-29 NOTE — Progress Notes (Signed)
   Follow-Up Visit   Subjective  Natalie Bradshaw is a 75 y.o. female who presents for the following: Procedure (Bcc x1 ).  Superficial BCC Location: Nasal tip Duration:  Quality:  Associated Signs/Symptoms: Modifying Factors:  Severity:  Timing: Context: For treatment  Objective  Well appearing patient in no apparent distress; mood and affect are within normal limits. Objective  Mid Tip of Nose: Biopsy site identified by patient, nurse, and me.  Objective  Left 4th Finger Tip: Solitary, smooth skin colored to translucent papule.    A focused examination was performed including Head and neck.. Relevant physical exam findings are noted in the Assessment and Plan.   Assessment & Plan    Basal cell carcinoma (BCC), unspecified site Mid Tip of Nose  Destruction of lesion Complexity: simple   Destruction method: electrodesiccation and curettage   Informed consent: discussed and consent obtained   Timeout:  patient name, date of birth, surgical site, and procedure verified Anesthesia: the lesion was anesthetized in a standard fashion   Anesthetic:  1% lidocaine w/ epinephrine 1-100,000 local infiltration Curettage performed in three different directions: Yes   Curettage cycles:  3 Lesion length (cm):  1 Lesion width (cm):  1 Margin per side (cm):  0 Final wound size (cm):  1 Hemostasis achieved with:  ferric subsulfate Outcome: patient tolerated procedure well with no complications   Additional details:  Wound innoculated with 5 fluorouracil solution.  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  The documentation on 04/29/20 for the exam, diagnosis, procedures, and orders are all accurate and complete.

## 2020-05-01 ENCOUNTER — Telehealth: Payer: Self-pay

## 2020-05-01 NOTE — Telephone Encounter (Signed)
Phone call to patient's Pharmacy to inform them that we will not do a prior authorization for the patient's Tretinoin Cream.  Patient is using this medication for wrinkles so she will have to pay cash for it.

## 2020-05-01 NOTE — Telephone Encounter (Signed)
Fax received from [facility] for prior authorization for Tretinoin 0.1% Cream.

## 2020-05-06 ENCOUNTER — Telehealth: Payer: Self-pay | Admitting: Dermatology

## 2020-05-06 NOTE — Telephone Encounter (Signed)
Patient calling wanting to know how long to use Vaseline, informed her just a few day.  Patient was worried about getting a scar- told her that was discussed with her per dr tafeen before the treatment was done  Informed her I cant say whether she will have a scar or not, everybody heals different.  Patient informed me that she has a PDT scheduled for 05/20/20: informed her that lesion needed to be healed before PDT.

## 2020-05-06 NOTE — Telephone Encounter (Signed)
1) Asked if she should still put ointment on nose; told instructions were in my chart + she could still use it if she wanted 2)Asked if it was going to heal; "it looks terrible, like someone punched me in the nose. I never would have had it done if I knew it would look like this"  Also, she's scheduled for PDT on 05/20/20; please see where it's being done & if face should she being doing it?

## 2020-05-20 ENCOUNTER — Ambulatory Visit: Payer: Medicare HMO | Admitting: Dermatology

## 2020-05-20 ENCOUNTER — Other Ambulatory Visit: Payer: Self-pay

## 2020-05-20 ENCOUNTER — Encounter: Payer: Self-pay | Admitting: Dermatology

## 2020-05-20 DIAGNOSIS — L570 Actinic keratosis: Secondary | ICD-10-CM

## 2020-05-20 MED ORDER — AMINOLEVULINIC ACID HCL 10 % EX GEL
2000.0000 mg | Freq: Once | CUTANEOUS | Status: AC
  Administered 2020-05-20: 2000 mg via TOPICAL

## 2020-05-20 NOTE — Patient Instructions (Signed)

## 2020-05-20 NOTE — Progress Notes (Signed)
Photodynamic Therapy Procedure Note Diagnosis: Actinic Keratosis Location: face Informed Consent: Discussed risks (burning, pain, redness, peeling, severe sunburn-like reaction, blistering, discoloration, lack of resolution) and benefits of the procedure, as well as the alternatives. Informed consent was obtained. Preparation: After cleansing the skin, the area to be treated was coated with Levulan.  This was allowed to sit on the skin for 1.5 hours. Procedure Details: The patient was placed under the light source with appropriate eye protection for 20 minutes. After completing the treatment, the patient applied sunscreen to the treated areas. Patient tolerated procedure well.  Plan: Avoid any sun exposure for the next 24 hours. Wear sunscreen daily for the next week. Observe normal sun precautions thereafter. Recommend OTC analgesia as needed for pain. Follow-up in 10 weeks .

## 2020-05-21 DIAGNOSIS — R300 Dysuria: Secondary | ICD-10-CM | POA: Diagnosis not present

## 2020-06-01 ENCOUNTER — Encounter: Payer: Self-pay | Admitting: Dermatology

## 2020-06-05 ENCOUNTER — Telehealth: Payer: Self-pay | Admitting: Dermatology

## 2020-06-05 NOTE — Telephone Encounter (Signed)
NOT A  PHONE CALL. Patient walked into office,requested Dr. Denna Haggard sign "Tinted Window Waiver Application Form" from [facility]; said he knew about it. Dr Denna Haggard signed form

## 2020-06-18 DIAGNOSIS — R69 Illness, unspecified: Secondary | ICD-10-CM | POA: Diagnosis not present

## 2020-06-18 DIAGNOSIS — Z6828 Body mass index (BMI) 28.0-28.9, adult: Secondary | ICD-10-CM | POA: Diagnosis not present

## 2020-06-18 DIAGNOSIS — M7989 Other specified soft tissue disorders: Secondary | ICD-10-CM | POA: Diagnosis not present

## 2020-06-18 DIAGNOSIS — M2550 Pain in unspecified joint: Secondary | ICD-10-CM | POA: Diagnosis not present

## 2020-06-18 DIAGNOSIS — M06042 Rheumatoid arthritis without rheumatoid factor, left hand: Secondary | ICD-10-CM | POA: Diagnosis not present

## 2020-06-18 DIAGNOSIS — M06041 Rheumatoid arthritis without rheumatoid factor, right hand: Secondary | ICD-10-CM | POA: Diagnosis not present

## 2020-06-18 DIAGNOSIS — M1990 Unspecified osteoarthritis, unspecified site: Secondary | ICD-10-CM | POA: Diagnosis not present

## 2020-06-18 DIAGNOSIS — E663 Overweight: Secondary | ICD-10-CM | POA: Diagnosis not present

## 2020-08-06 ENCOUNTER — Other Ambulatory Visit: Payer: Self-pay

## 2020-08-06 ENCOUNTER — Encounter: Payer: Self-pay | Admitting: Physician Assistant

## 2020-08-06 ENCOUNTER — Ambulatory Visit: Payer: Medicare HMO | Admitting: Physician Assistant

## 2020-08-06 DIAGNOSIS — Z86018 Personal history of other benign neoplasm: Secondary | ICD-10-CM | POA: Diagnosis not present

## 2020-08-06 DIAGNOSIS — D0472 Carcinoma in situ of skin of left lower limb, including hip: Secondary | ICD-10-CM | POA: Diagnosis not present

## 2020-08-06 DIAGNOSIS — Z1283 Encounter for screening for malignant neoplasm of skin: Secondary | ICD-10-CM | POA: Diagnosis not present

## 2020-08-06 DIAGNOSIS — L219 Seborrheic dermatitis, unspecified: Secondary | ICD-10-CM

## 2020-08-06 DIAGNOSIS — C4492 Squamous cell carcinoma of skin, unspecified: Secondary | ICD-10-CM

## 2020-08-06 DIAGNOSIS — Z85828 Personal history of other malignant neoplasm of skin: Secondary | ICD-10-CM | POA: Diagnosis not present

## 2020-08-06 DIAGNOSIS — D485 Neoplasm of uncertain behavior of skin: Secondary | ICD-10-CM

## 2020-08-06 HISTORY — DX: Squamous cell carcinoma of skin, unspecified: C44.92

## 2020-08-06 MED ORDER — KETOCONAZOLE 2 % EX CREA: 1.0000 "application " | TOPICAL_CREAM | Freq: Two times a day (BID) | CUTANEOUS | 10 refills | Status: AC

## 2020-08-06 MED ORDER — ALCLOMETASONE DIPROPIONATE 0.05 % EX CREA: TOPICAL_CREAM | Freq: Two times a day (BID) | CUTANEOUS | 3 refills | Status: DC | PRN

## 2020-08-06 NOTE — Progress Notes (Deleted)
   Follow-Up Visit   Subjective  Natalie Bradshaw is a 75 y.o. female who presents for the following: Follow-up (Follow up on PDT on face. Had a little reaction but likes cream (tretinoin) better. She doesn't have any new lesions. Patient has new spot left lower leg  x 2 months. Is rough and scaly. Patient has history of BCC and atypical mole.   The following portions of the chart were reviewed this encounter and updated as appropriate:  All ROS are negative.    Objective  Well appearing patient in no apparent distress; mood and affect are within normal limits.  All skin waist up examined.  Objective  Head - Anterior (Face), Left Ear, Right Ear: Erythematous plaques with greasy scale.   Objective  Left Lower Leg - Anterior: Volcano growth on pink base      Assessment & Plan    Neoplasm of uncertain behavior of skin Left Lower Leg - Anterior  Skin / nail biopsy Type of biopsy: tangential   Informed consent: discussed and consent obtained   Timeout: patient name, date of birth, surgical site, and procedure verified   Anesthesia: the lesion was anesthetized in a standard fashion   Anesthetic:  1% lidocaine w/ epinephrine 1-100,000 local infiltration Instrument used: flexible razor blade   Hemostasis achieved with: ferric subsulfate   Outcome: patient tolerated procedure well   Post-procedure details: wound care instructions given    Specimen 1 - Surgical pathology Differential Diagnosis: scc vs bcc vs atypia  Check Margins: No Lentigines - Scattered tan macules - Discussed due to sun exposure - Benign, observe - Call for any changes  Seborrheic Keratoses - Stuck-on, waxy, tan-brown papules and plaques  - Discussed benign etiology and prognosis. - Observe - Call for any changes  Melanocytic Nevi - Tan-brown and/or pink-flesh-colored symmetric macules and papules - Benign appearing on exam today - Observation - Call clinic for new or changing moles - Recommend  daily use of broad spectrum spf 30+ sunscreen to sun-exposed areas.   Hemangiomas - Red papules - Discussed benign nature - Observe - Call for any changes  Actinic Damage - diffuse scaly erythematous macules with underlying dyspigmentation - Recommend daily broad spectrum sunscreen SPF 30+ to sun-exposed areas, reapply every 2 hours as needed.  - Call for new or changing lesions.  Skin cancer screening performed today.   I, Breindy Meadow, PA-C, have reviewed all documentation's for this visit.  The documentation on 08/19/20 for the exam, diagnosis, procedures and orders are all accurate and complete.

## 2020-08-19 NOTE — Addendum Note (Signed)
Addended by: Robyne Askew R on: 08/19/2020 09:19 AM   Modules accepted: Level of Service

## 2020-08-21 NOTE — Progress Notes (Signed)
   Follow-Up Visit   Subjective  Natalie Bradshaw is a 75 y.o. female who presents for the following: Follow-up (Follow up on PDT on face. Had a little reaction but likes cream better. /Patient has new spot left lower leg  x 2 months. Is rough and scaly. Patient has history of BCC and atypical mole. ).   The following portions of the chart were reviewed this encounter and updated as appropriate:  Tobacco  Allergies  Meds  Problems  Med Hx  Surg Hx  Fam Hx      Objective  Well appearing patient in no apparent distress; mood and affect are within normal limits.  A full examination was performed including scalp, head, eyes, ears, nose, lips, neck, chest, axillae, abdomen, back, buttocks, bilateral upper extremities, bilateral lower extremities, hands, feet, fingers, toes, fingernails, and toenails. All findings within normal limits unless otherwise noted below.  Objective  Head - Anterior (Face), Left Ear, Right Ear: Erythematous plaques with greasy scale.   Objective  Left Lower Leg - Anterior: Volcano growth on pink base       Assessment & Plan  Seborrheic dermatitis (3) Head - Anterior (Face); Left Ear; Right Ear  ketoconazole (NIZORAL) 2 % cream - Head - Anterior (Face), Left Ear, Right Ear  alclomethasone (ACLOVATE) 0.05 % cream - Head - Anterior (Face), Left Ear, Right Ear  Neoplasm of uncertain behavior of skin Left Lower Leg - Anterior  Skin / nail biopsy Type of biopsy: tangential   Informed consent: discussed and consent obtained   Timeout: patient name, date of birth, surgical site, and procedure verified   Anesthesia: the lesion was anesthetized in a standard fashion   Anesthetic:  1% lidocaine w/ epinephrine 1-100,000 local infiltration Instrument used: flexible razor blade   Hemostasis achieved with: ferric subsulfate   Outcome: patient tolerated procedure well   Post-procedure details: wound care instructions given    Specimen 1 - Surgical  pathology Differential Diagnosis: scc vs bcc vs atypia  Check Margins: No    I, Xaviar Lunn, PA-C, have reviewed all documentation's for this visit.  The documentation on 08/21/20 for the exam, diagnosis, procedures and orders are all accurate and complete.

## 2020-08-26 ENCOUNTER — Telehealth: Payer: Self-pay

## 2020-08-26 NOTE — Telephone Encounter (Signed)
-----   Message from Warren Danes, [place] sent at 08/14/2020  1:41 PM EDT ----- 30

## 2020-08-26 NOTE — Telephone Encounter (Signed)
Phone call to patient with her pathology results. Patient aware.  

## 2020-09-13 DIAGNOSIS — M06042 Rheumatoid arthritis without rheumatoid factor, left hand: Secondary | ICD-10-CM | POA: Diagnosis not present

## 2020-09-13 DIAGNOSIS — M06041 Rheumatoid arthritis without rheumatoid factor, right hand: Secondary | ICD-10-CM | POA: Diagnosis not present

## 2020-09-13 DIAGNOSIS — R69 Illness, unspecified: Secondary | ICD-10-CM | POA: Diagnosis not present

## 2020-09-13 DIAGNOSIS — M1990 Unspecified osteoarthritis, unspecified site: Secondary | ICD-10-CM | POA: Diagnosis not present

## 2020-09-13 DIAGNOSIS — M2550 Pain in unspecified joint: Secondary | ICD-10-CM | POA: Diagnosis not present

## 2020-09-13 DIAGNOSIS — Z13220 Encounter for screening for lipoid disorders: Secondary | ICD-10-CM | POA: Diagnosis not present

## 2020-09-13 DIAGNOSIS — Z6828 Body mass index (BMI) 28.0-28.9, adult: Secondary | ICD-10-CM | POA: Diagnosis not present

## 2020-09-13 DIAGNOSIS — E663 Overweight: Secondary | ICD-10-CM | POA: Diagnosis not present

## 2020-09-13 DIAGNOSIS — M7989 Other specified soft tissue disorders: Secondary | ICD-10-CM | POA: Diagnosis not present

## 2020-09-24 DIAGNOSIS — M5431 Sciatica, right side: Secondary | ICD-10-CM | POA: Diagnosis not present

## 2020-09-24 DIAGNOSIS — M9903 Segmental and somatic dysfunction of lumbar region: Secondary | ICD-10-CM | POA: Diagnosis not present

## 2020-09-30 DIAGNOSIS — M9903 Segmental and somatic dysfunction of lumbar region: Secondary | ICD-10-CM | POA: Diagnosis not present

## 2020-09-30 DIAGNOSIS — M5431 Sciatica, right side: Secondary | ICD-10-CM | POA: Diagnosis not present

## 2020-10-03 DIAGNOSIS — M9903 Segmental and somatic dysfunction of lumbar region: Secondary | ICD-10-CM | POA: Diagnosis not present

## 2020-10-03 DIAGNOSIS — M5431 Sciatica, right side: Secondary | ICD-10-CM | POA: Diagnosis not present

## 2020-10-09 DIAGNOSIS — M5431 Sciatica, right side: Secondary | ICD-10-CM | POA: Diagnosis not present

## 2020-10-09 DIAGNOSIS — M9903 Segmental and somatic dysfunction of lumbar region: Secondary | ICD-10-CM | POA: Diagnosis not present

## 2020-10-21 DIAGNOSIS — M9903 Segmental and somatic dysfunction of lumbar region: Secondary | ICD-10-CM | POA: Diagnosis not present

## 2020-10-21 DIAGNOSIS — M5431 Sciatica, right side: Secondary | ICD-10-CM | POA: Diagnosis not present

## 2020-11-03 ENCOUNTER — Encounter: Payer: Self-pay | Admitting: Dermatology

## 2020-11-04 DIAGNOSIS — M5431 Sciatica, right side: Secondary | ICD-10-CM | POA: Diagnosis not present

## 2020-11-04 DIAGNOSIS — M9903 Segmental and somatic dysfunction of lumbar region: Secondary | ICD-10-CM | POA: Diagnosis not present

## 2020-11-07 DIAGNOSIS — M9903 Segmental and somatic dysfunction of lumbar region: Secondary | ICD-10-CM | POA: Diagnosis not present

## 2020-11-07 DIAGNOSIS — M5431 Sciatica, right side: Secondary | ICD-10-CM | POA: Diagnosis not present

## 2020-11-11 DIAGNOSIS — M9903 Segmental and somatic dysfunction of lumbar region: Secondary | ICD-10-CM | POA: Diagnosis not present

## 2020-11-11 DIAGNOSIS — M5431 Sciatica, right side: Secondary | ICD-10-CM | POA: Diagnosis not present

## 2020-11-18 DIAGNOSIS — E663 Overweight: Secondary | ICD-10-CM | POA: Diagnosis not present

## 2020-11-18 DIAGNOSIS — M0609 Rheumatoid arthritis without rheumatoid factor, multiple sites: Secondary | ICD-10-CM | POA: Diagnosis not present

## 2020-11-18 DIAGNOSIS — Z79899 Other long term (current) drug therapy: Secondary | ICD-10-CM | POA: Diagnosis not present

## 2020-11-18 DIAGNOSIS — Z6826 Body mass index (BMI) 26.0-26.9, adult: Secondary | ICD-10-CM | POA: Diagnosis not present

## 2020-11-18 DIAGNOSIS — M2550 Pain in unspecified joint: Secondary | ICD-10-CM | POA: Diagnosis not present

## 2020-11-22 DIAGNOSIS — R69 Illness, unspecified: Secondary | ICD-10-CM | POA: Diagnosis not present

## 2020-11-22 DIAGNOSIS — R7301 Impaired fasting glucose: Secondary | ICD-10-CM | POA: Diagnosis not present

## 2020-11-22 DIAGNOSIS — Z23 Encounter for immunization: Secondary | ICD-10-CM | POA: Diagnosis not present

## 2020-11-22 DIAGNOSIS — Z5181 Encounter for therapeutic drug level monitoring: Secondary | ICD-10-CM | POA: Diagnosis not present

## 2020-11-22 DIAGNOSIS — E782 Mixed hyperlipidemia: Secondary | ICD-10-CM | POA: Diagnosis not present

## 2020-11-22 DIAGNOSIS — Z0000 Encounter for general adult medical examination without abnormal findings: Secondary | ICD-10-CM | POA: Diagnosis not present

## 2020-12-03 ENCOUNTER — Ambulatory Visit (INDEPENDENT_AMBULATORY_CARE_PROVIDER_SITE_OTHER): Payer: Medicare HMO | Admitting: Physician Assistant

## 2020-12-03 ENCOUNTER — Other Ambulatory Visit: Payer: Self-pay

## 2020-12-03 ENCOUNTER — Encounter: Payer: Self-pay | Admitting: Physician Assistant

## 2020-12-03 DIAGNOSIS — C44729 Squamous cell carcinoma of skin of left lower limb, including hip: Secondary | ICD-10-CM | POA: Diagnosis not present

## 2020-12-03 NOTE — Patient Instructions (Addendum)
Advanced laser  Biopsy, Surgery (Curettage) & Surgery (Excision) Aftercare Instructions  1. Okay to remove bandage in 24 hours  2. Wash area with soap and water  3. Apply Vaseline to area twice daily until healed (Not Neosporin)  4. Okay to cover with a Band-Aid to decrease the chance of infection or prevent irritation from clothing; also it's okay to uncover lesion at home.  5. Suture instructions: return to our office in 7-10 or 10-14 days for a nurse visit for suture removal. Variable healing with sutures, if pain or itching occurs call our office. It's okay to shower or bathe 24 hours after sutures are given.  6. The following risks may occur after a biopsy, curettage or excision: bleeding, scarring, discoloration, recurrence, infection (redness, yellow drainage, pain or swelling).  7. For questions, concerns and results call our office at [place] before 4pm & Friday before 3pm. Biopsy results will be available in 1 week.

## 2020-12-03 NOTE — Progress Notes (Signed)
   Follow-Up Visit   Subjective  Natalie Bradshaw is a 75 y.o. female who presents for the following: Procedure (CIS left lower leg).   The following portions of the chart were reviewed this encounter and updated as appropriate:  Tobacco  Allergies  Meds  Problems  Med Hx  Surg Hx  Fam Hx      Objective  Well appearing patient in no apparent distress; mood and affect are within normal limits.  A focused examination was performed including legs and face. Relevant physical exam findings are noted in the Assessment and Plan.  Left Lower Leg - Anterior Pink macule. Healed very well.   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, including hip Left Lower Leg - Anterior  Destruction of lesion Complexity: simple   Destruction method: electrodesiccation and curettage   Informed consent: discussed and consent obtained   Timeout:  patient name, date of birth, surgical site, and procedure verified Anesthesia: the lesion was anesthetized in a standard fashion   Anesthetic:  1% lidocaine w/ epinephrine 1-100,000 local infiltration Curettage performed in three different directions: Yes   Electrodesiccation performed over the curetted area: Yes   Curettage cycles:  3 Margin per side (cm):  0.1 Final wound size (cm):  1.6 Hemostasis achieved with:  aluminum chloride Outcome: patient tolerated procedure well with no complications   Post-procedure details: wound care instructions given      I, Sameer Teeple, PA-C, have reviewed all documentation's for this visit.  The documentation on 12/03/20 for the exam, diagnosis, procedures and orders are all accurate and complete.

## 2020-12-26 DIAGNOSIS — H40012 Open angle with borderline findings, low risk, left eye: Secondary | ICD-10-CM | POA: Diagnosis not present

## 2020-12-27 DIAGNOSIS — N83202 Unspecified ovarian cyst, left side: Secondary | ICD-10-CM | POA: Diagnosis not present

## 2021-01-07 ENCOUNTER — Ambulatory Visit: Payer: Medicare HMO | Admitting: Physician Assistant

## 2021-01-29 ENCOUNTER — Other Ambulatory Visit: Payer: Self-pay | Admitting: Nurse Practitioner

## 2021-01-29 DIAGNOSIS — R102 Pelvic and perineal pain: Secondary | ICD-10-CM | POA: Diagnosis not present

## 2021-01-29 DIAGNOSIS — N644 Mastodynia: Secondary | ICD-10-CM

## 2021-01-29 DIAGNOSIS — Z01419 Encounter for gynecological examination (general) (routine) without abnormal findings: Secondary | ICD-10-CM | POA: Diagnosis not present

## 2021-01-29 DIAGNOSIS — R69 Illness, unspecified: Secondary | ICD-10-CM | POA: Diagnosis not present

## 2021-01-29 DIAGNOSIS — K5900 Constipation, unspecified: Secondary | ICD-10-CM | POA: Diagnosis not present

## 2021-02-24 DIAGNOSIS — Z79899 Other long term (current) drug therapy: Secondary | ICD-10-CM | POA: Diagnosis not present

## 2021-02-24 DIAGNOSIS — E663 Overweight: Secondary | ICD-10-CM | POA: Diagnosis not present

## 2021-02-24 DIAGNOSIS — R5383 Other fatigue: Secondary | ICD-10-CM | POA: Diagnosis not present

## 2021-02-24 DIAGNOSIS — M2550 Pain in unspecified joint: Secondary | ICD-10-CM | POA: Diagnosis not present

## 2021-02-24 DIAGNOSIS — M1990 Unspecified osteoarthritis, unspecified site: Secondary | ICD-10-CM | POA: Diagnosis not present

## 2021-02-24 DIAGNOSIS — Z6829 Body mass index (BMI) 29.0-29.9, adult: Secondary | ICD-10-CM | POA: Diagnosis not present

## 2021-02-24 DIAGNOSIS — M0609 Rheumatoid arthritis without rheumatoid factor, multiple sites: Secondary | ICD-10-CM | POA: Diagnosis not present

## 2021-03-07 ENCOUNTER — Other Ambulatory Visit: Payer: Medicare HMO

## 2021-03-10 DIAGNOSIS — R7612 Nonspecific reaction to cell mediated immunity measurement of gamma interferon antigen response without active tuberculosis: Secondary | ICD-10-CM | POA: Diagnosis not present

## 2021-03-13 ENCOUNTER — Ambulatory Visit: Payer: Medicare HMO | Admitting: Physician Assistant

## 2021-03-13 ENCOUNTER — Other Ambulatory Visit: Payer: Self-pay

## 2021-03-13 ENCOUNTER — Encounter: Payer: Self-pay | Admitting: Physician Assistant

## 2021-03-13 DIAGNOSIS — D2272 Melanocytic nevi of left lower limb, including hip: Secondary | ICD-10-CM

## 2021-03-13 DIAGNOSIS — D225 Melanocytic nevi of trunk: Secondary | ICD-10-CM | POA: Diagnosis not present

## 2021-03-13 DIAGNOSIS — Z1283 Encounter for screening for malignant neoplasm of skin: Secondary | ICD-10-CM

## 2021-03-13 DIAGNOSIS — Z85828 Personal history of other malignant neoplasm of skin: Secondary | ICD-10-CM | POA: Diagnosis not present

## 2021-03-13 DIAGNOSIS — L570 Actinic keratosis: Secondary | ICD-10-CM | POA: Diagnosis not present

## 2021-03-13 DIAGNOSIS — L988 Other specified disorders of the skin and subcutaneous tissue: Secondary | ICD-10-CM

## 2021-03-13 DIAGNOSIS — D485 Neoplasm of uncertain behavior of skin: Secondary | ICD-10-CM

## 2021-03-13 MED ORDER — TRETINOIN 0.1 % EX CREA: TOPICAL_CREAM | Freq: Every day | CUTANEOUS | 0 refills | Status: DC

## 2021-03-13 MED ORDER — TOLAK 4 % EX CREA: TOPICAL_CREAM | CUTANEOUS | 2 refills | Status: DC

## 2021-03-13 NOTE — Patient Instructions (Signed)

## 2021-03-19 ENCOUNTER — Telehealth: Payer: Self-pay | Admitting: *Deleted

## 2021-03-19 NOTE — Telephone Encounter (Signed)
-----   Message from Warren Danes, [place] sent at 03/18/2021  5:51 PM EST ----- WS x 2

## 2021-03-19 NOTE — Telephone Encounter (Signed)
Left message for patient to return our call for pathology results.

## 2021-03-20 NOTE — Telephone Encounter (Signed)
Patient is returning phone call about pathology results from last visit with [facility], PA-C.

## 2021-03-20 NOTE — Telephone Encounter (Signed)
Path to patient January surgery made x2 w/s

## 2021-03-24 ENCOUNTER — Encounter: Payer: Self-pay | Admitting: Physician Assistant

## 2021-03-24 NOTE — Progress Notes (Signed)
° °  Follow-Up Visit   Subjective  Natalie Bradshaw is a 75 y.o. female who presents for the following: Annual Exam (Patient here today for yearly skin check. Patient states that dermatologist in [place] was watching a lesion on the right outer brow that does itch. No bleeding. Patient would also discuss tolak cream. Personal history of non mole skin cancer. No family history of atypical moles, melanoma, non mole skin cancer. Patient would also like to increase her Tretinoin cream.).   The following portions of the chart were reviewed this encounter and updated as appropriate:  Tobacco   Allergies   Meds   Problems   Med Hx   Surg Hx   Fam Hx       Objective  Well appearing patient in no apparent distress; mood and affect are within normal limits.  A full examination was performed including scalp, head, eyes, ears, nose, lips, neck, chest, axillae, abdomen, back, buttocks, bilateral upper extremities, bilateral lower extremities, hands, feet, fingers, toes, fingernails, and toenails. All findings within normal limits unless otherwise noted below.  Left Thigh - Anterior Bichromic dark nested macule.        Left Chest Bichromic dark nested macule.        Mid Forehead Erythematous patches with gritty scale.  Left Malar Cheek, Right Malar Cheek Fine lines   Assessment & Plan  Neoplasm of uncertain behavior of skin (2) Left Thigh - Anterior  Skin / nail biopsy Type of biopsy: tangential   Informed consent: discussed and consent obtained   Timeout: patient name, date of birth, surgical site, and procedure verified   Procedure prep:  Patient was prepped and draped in usual sterile fashion (Non sterile) Prep type:  Chlorhexidine Anesthesia: the lesion was anesthetized in a standard fashion   Anesthetic:  1% lidocaine w/ epinephrine 1-100,000 local infiltration Instrument used: flexible razor blade   Outcome: patient tolerated procedure well   Post-procedure details: wound care  instructions given    Specimen 1 - Surgical pathology Differential Diagnosis: r/o atypia  Check Margins: No  Left Chest  Skin / nail biopsy Type of biopsy: tangential   Informed consent: discussed and consent obtained   Timeout: patient name, date of birth, surgical site, and procedure verified   Procedure prep:  Patient was prepped and draped in usual sterile fashion (Non sterile) Prep type:  Chlorhexidine Anesthesia: the lesion was anesthetized in a standard fashion   Anesthetic:  1% lidocaine w/ epinephrine 1-100,000 local infiltration Instrument used: flexible razor blade   Outcome: patient tolerated procedure well   Post-procedure details: wound care instructions given    Specimen 2 - Surgical pathology Differential Diagnosis: r/o  atypia  Check Margins: No  AK (actinic keratosis) Mid Forehead  Fluorouracil (TOLAK) 4 % CREA - Mid Forehead Apply to affected area qhs Monday- Sunday x 2 weeks  Age-related facial wrinkles (2) Left Malar Cheek; Right Malar Cheek  tretinoin (RETIN-A) 0.1 % cream - Left Malar Cheek, Right Malar Cheek Apply topically at bedtime.    I, Anedra Penafiel, PA-C, have reviewed all documentation's for this visit.  The documentation on 03/24/21 for the exam, diagnosis, procedures and orders are all accurate and complete.

## 2021-04-15 ENCOUNTER — Other Ambulatory Visit: Payer: Self-pay

## 2021-04-15 ENCOUNTER — Emergency Department (HOSPITAL_BASED_OUTPATIENT_CLINIC_OR_DEPARTMENT_OTHER): Payer: Medicare HMO

## 2021-04-15 ENCOUNTER — Encounter (HOSPITAL_BASED_OUTPATIENT_CLINIC_OR_DEPARTMENT_OTHER): Payer: Self-pay | Admitting: Emergency Medicine

## 2021-04-15 ENCOUNTER — Emergency Department (HOSPITAL_BASED_OUTPATIENT_CLINIC_OR_DEPARTMENT_OTHER)
Admission: EM | Admit: 2021-04-15 | Discharge: 2021-04-15 | Disposition: A | Discharge status: Home / Self Care | Payer: Medicare HMO | Attending: Emergency Medicine | Admitting: Emergency Medicine

## 2021-04-15 DIAGNOSIS — K5730 Diverticulosis of large intestine without perforation or abscess without bleeding: Secondary | ICD-10-CM | POA: Diagnosis not present

## 2021-04-15 DIAGNOSIS — R1031 Right lower quadrant pain: Secondary | ICD-10-CM | POA: Diagnosis not present

## 2021-04-15 DIAGNOSIS — M5441 Lumbago with sciatica, right side: Secondary | ICD-10-CM | POA: Diagnosis not present

## 2021-04-15 DIAGNOSIS — Z85828 Personal history of other malignant neoplasm of skin: Secondary | ICD-10-CM | POA: Diagnosis not present

## 2021-04-15 DIAGNOSIS — M5431 Sciatica, right side: Secondary | ICD-10-CM | POA: Diagnosis not present

## 2021-04-15 DIAGNOSIS — N83202 Unspecified ovarian cyst, left side: Secondary | ICD-10-CM | POA: Diagnosis not present

## 2021-04-15 DIAGNOSIS — M5430 Sciatica, unspecified side: Secondary | ICD-10-CM

## 2021-04-15 DIAGNOSIS — K449 Diaphragmatic hernia without obstruction or gangrene: Secondary | ICD-10-CM | POA: Diagnosis not present

## 2021-04-15 DIAGNOSIS — M545 Low back pain, unspecified: Secondary | ICD-10-CM | POA: Diagnosis present

## 2021-04-15 DIAGNOSIS — I700 Atherosclerosis of aorta: Secondary | ICD-10-CM | POA: Diagnosis not present

## 2021-04-15 LAB — CBC WITH DIFFERENTIAL/PLATELET
Abs Immature Granulocytes: 0.03 10*3/uL (ref 0.00–0.07)
Basophils Absolute: 0 10*3/uL (ref 0.0–0.1)
Basophils Relative: 0 %
Eosinophils Absolute: 0.1 10*3/uL (ref 0.0–0.5)
Eosinophils Relative: 1 %
HCT: 39.3 % (ref 36.0–46.0)
Hemoglobin: 12.7 g/dL (ref 12.0–15.0)
Immature Granulocytes: 1 %
Lymphocytes Relative: 25 %
Lymphs Abs: 1.3 10*3/uL (ref 0.7–4.0)
MCH: 31 pg (ref 26.0–34.0)
MCHC: 32.3 g/dL (ref 30.0–36.0)
MCV: 95.9 fL (ref 80.0–100.0)
Monocytes Absolute: 0.3 10*3/uL (ref 0.1–1.0)
Monocytes Relative: 6 %
Neutro Abs: 3.6 10*3/uL (ref 1.7–7.7)
Neutrophils Relative %: 67 %
Platelets: 308 10*3/uL (ref 150–400)
RBC: 4.1 MIL/uL (ref 3.87–5.11)
RDW: 13.7 % (ref 11.5–15.5)
WBC: 5.4 10*3/uL (ref 4.0–10.5)
nRBC: 0 % (ref 0.0–0.2)

## 2021-04-15 LAB — URINALYSIS, ROUTINE W REFLEX MICROSCOPIC
Bilirubin Urine: NEGATIVE
Glucose, UA: NEGATIVE mg/dL
Hgb urine dipstick: NEGATIVE
Ketones, ur: NEGATIVE mg/dL
Leukocytes,Ua: NEGATIVE
Nitrite: NEGATIVE
Protein, ur: NEGATIVE mg/dL
Specific Gravity, Urine: 1.015 (ref 1.005–1.030)
pH: 5.5 (ref 5.0–8.0)

## 2021-04-15 LAB — COMPREHENSIVE METABOLIC PANEL
ALT: 12 U/L (ref 0–44)
AST: 16 U/L (ref 15–41)
Albumin: 4.4 g/dL (ref 3.5–5.0)
Alkaline Phosphatase: 42 U/L (ref 38–126)
Anion gap: 7 (ref 5–15)
BUN: 17 mg/dL (ref 8–23)
CO2: 29 mmol/L (ref 22–32)
Calcium: 9.5 mg/dL (ref 8.9–10.3)
Chloride: 102 mmol/L (ref 98–111)
Creatinine, Ser: 0.98 mg/dL (ref 0.44–1.00)
GFR, Estimated: 59 mL/min — ABNORMAL LOW (ref >60)
Glucose, Bld: 137 mg/dL — ABNORMAL HIGH (ref 70–99)
Potassium: 4.3 mmol/L (ref 3.5–5.1)
Sodium: 138 mmol/L (ref 135–145)
Total Bilirubin: 0.8 mg/dL (ref 0.3–1.2)
Total Protein: 7.1 g/dL (ref 6.5–8.1)

## 2021-04-15 LAB — LIPASE, BLOOD: Lipase: 25 U/L (ref 11–51)

## 2021-04-15 MED ORDER — SODIUM CHLORIDE 0.9 % IV SOLN: INTRAVENOUS | Status: DC

## 2021-04-15 MED ORDER — HYDROCODONE-ACETAMINOPHEN 5-325 MG PO TABS
1.0000 | ORAL_TABLET | Freq: Once | ORAL | Status: AC
  Administered 2021-04-15: 1 via ORAL
  Filled 2021-04-15: qty 1

## 2021-04-15 MED ORDER — ONDANSETRON HCL 4 MG/2ML IJ SOLN
4.0000 mg | Freq: Once | INTRAMUSCULAR | Status: AC
  Administered 2021-04-15: 4 mg via INTRAVENOUS
  Filled 2021-04-15: qty 2

## 2021-04-15 MED ORDER — PREDNISONE 10 MG PO TABS: ORAL_TABLET | ORAL | 0 refills | Status: AC

## 2021-04-15 MED ORDER — IOHEXOL 300 MG/ML  SOLN
80.0000 mL | Freq: Once | INTRAMUSCULAR | Status: AC | PRN
  Administered 2021-04-15: 80 mL via INTRAVENOUS

## 2021-04-15 MED ORDER — SODIUM CHLORIDE 0.9 % IV BOLUS
500.0000 mL | Freq: Once | INTRAVENOUS | Status: AC
  Administered 2021-04-15: 500 mL via INTRAVENOUS

## 2021-04-15 MED ORDER — HYDROMORPHONE HCL 1 MG/ML IJ SOLN
0.5000 mg | Freq: Once | INTRAMUSCULAR | Status: AC
  Administered 2021-04-15: 0.5 mg via INTRAVENOUS
  Filled 2021-04-15: qty 1

## 2021-04-15 MED ORDER — HYDROCODONE-ACETAMINOPHEN 5-325 MG PO TABS
1.0000 | ORAL_TABLET | Freq: Four times a day (QID) | ORAL | 0 refills | Status: DC | PRN
Start: 2021-04-15 — End: 2023-06-17

## 2021-04-15 NOTE — ED Provider Notes (Signed)
[facility] EMERGENCY DEPT Provider Note   CSN: [id number] Arrival date & time: 04/15/21  0857     History  Chief Complaint  Patient presents with   Back Pain    Natalie Bradshaw is a 76 y.o. female.  Patient presented to the ER for evaluation of   Back Pain  Patient has a history of squamous cell carcinoma and presents to the ED with complaints of back pain.  Patient states pain started in her right lower buttock/lower back region a couple of days ago.  Pain has been increasing intensity and this morning it was very severe.  Pain is sharp.  It only gets better if she lies in the fetal position.  She feels the pain moving to the right lower abdomen region.  She denies any nausea or vomiting.  No dysuria.  No diarrhea.  Patient felt like her legs were getting weak this morning when the pain was severe.  She denies any focal numbness.  Denies any weakness now.  No fevers or chills.  No similar symptoms in the past.  Home Medications Prior to Admission medications   Medication Sig Start Date End Date Taking? Authorizing Provider  HYDROcodone-acetaminophen (NORCO/VICODIN) 5-325 MG tablet Take 1 tablet by mouth every 6 (six) hours as needed. 04/15/21  Yes Dorie Rank, MD  predniSONE (DELTASONE) 10 MG tablet Take 6 tablets (60 mg total) by mouth daily with breakfast for 2 days, THEN 5 tablets (50 mg total) daily with breakfast for 2 days, THEN 4 tablets (40 mg total) daily with breakfast for 2 days, THEN 3 tablets (30 mg total) daily with breakfast for 2 days, THEN 2 tablets (20 mg total) daily with breakfast for 2 days, THEN 1 tablet (10 mg total) daily with breakfast for 2 days. 04/15/21 04/27/21 Yes Dorie Rank, MD  alclomethasone (ACLOVATE) 0.05 % cream Apply topically 2 (two) times daily as needed (Rash). 08/06/20   Sheffield, Vida Roller R, PA-C  atorvastatin (LIPITOR) 20 MG tablet Take 20 mg by mouth daily. 06/18/19   [provider]  Fluorouracil (TOLAK) 4 % CREA Apply to  affected area qhs Monday- Sunday x 2 weeks 03/13/21   Robyne Askew R, PA-C  traZODone (DESYREL) 50 MG tablet TAKE 1 TABLET BY MOUTH EVERY DAY AT BEDTIME AS NEEDED 05/28/19   [provider]  tretinoin (RETIN-A) 0.1 % cream Apply topically at bedtime. 03/13/21 03/13/22  Warren Danes, PA-C      Allergies    Leflunomide    Review of Systems   Review of Systems  Musculoskeletal:  Positive for back pain.   Physical Exam Updated Vital Signs BP 136/74    Pulse (!) 54    Temp 97.9 F (36.6 C) (Oral)    Resp 18    Ht 1.702 m (5\' 7" )    Wt 80.7 kg    SpO2 100%    BMI 27.88 kg/m  Physical Exam Vitals and nursing note reviewed.  Constitutional:      Appearance: She is well-developed.     Comments: Appears to be in pain  HENT:     Head: Normocephalic and atraumatic.     Right Ear: External ear normal.     Left Ear: External ear normal.  Eyes:     General: No scleral icterus.       Right eye: No discharge.        Left eye: No discharge.     Conjunctiva/sclera: Conjunctivae normal.  Neck:     Trachea:  No tracheal deviation.  Cardiovascular:     Rate and Rhythm: Normal rate and regular rhythm.  Pulmonary:     Effort: Pulmonary effort is normal. No respiratory distress.     Breath sounds: Normal breath sounds. No stridor. No wheezing or rales.  Abdominal:     General: Bowel sounds are normal. There is no distension.     Palpations: Abdomen is soft.     Tenderness: There is abdominal tenderness. There is no guarding or rebound.     Comments: Mild tenderness in the right lower abdomen  Musculoskeletal:        General: No tenderness or deformity.     Cervical back: Neck supple.     Comments: No tenderness palpation in the lumbar spine region normal strength and sensation bilateral lower extremities, patient does have pain when trying to lie back in the bed  Skin:    General: Skin is warm and dry.     Findings: No rash.  Neurological:     General: No focal deficit  present.     Mental Status: She is alert.     Cranial Nerves: No cranial nerve deficit (no facial droop, extraocular movements intact, no slurred speech).     Sensory: No sensory deficit.     Motor: No abnormal muscle tone or seizure activity.     Coordination: Coordination normal.  Psychiatric:        Mood and Affect: Mood normal.    ED Results / Procedures / Treatments   Labs (all labs ordered are listed, but only abnormal results are displayed) Labs Reviewed  COMPREHENSIVE METABOLIC PANEL - Abnormal; Notable for the following components:      Result Value   Glucose, Bld 137 (*)    GFR, Estimated 59 (*)    All other components within normal limits  LIPASE, BLOOD  CBC WITH DIFFERENTIAL/PLATELET  URINALYSIS, ROUTINE W REFLEX MICROSCOPIC    EKG None  Radiology CT ABDOMEN PELVIS W CONTRAST  Result Date: 04/15/2021 CLINICAL DATA:  Back pain for 2 days EXAM: CT ABDOMEN AND PELVIS WITH CONTRAST TECHNIQUE: Multidetector CT imaging of the abdomen and pelvis was performed using the standard protocol following bolus administration of intravenous contrast. CONTRAST:  88mL OMNIPAQUE IOHEXOL 300 MG/ML  SOLN COMPARISON:  None. FINDINGS: Lower chest: Lung bases are clear.  Imaged heart is unremarkable. Hepatobiliary: The liver and gallbladder are unremarkable. There is no biliary ductal dilatation. Pancreas: Unremarkable. Spleen: Unremarkable. Adrenals/Urinary Tract: The adrenals are unremarkable. Kidneys are unremarkable, with no focal lesion, stone, hydronephrosis, or hydroureter. The bladder is unremarkable. Stomach/Bowel: There is a moderate size hiatal hernia. The stomach is otherwise unremarkable. There is no evidence of bowel obstruction. Is extensive distal colonic diverticulosis without evidence of acute diverticulitis. The appendix is not identified, but there is no pericecal inflammatory change. Vascular/Lymphatic: There is scattered calcified atherosclerotic plaque throughout the  nonaneurysmal abdominal aorta. The major branch vessels are patent. Main portal and splenic veins are patent. There is no abdominal or pelvic lymphadenopathy. Reproductive: Uterus is unremarkable. There is a coarse calcification in the left ovary measuring proximally 5 mm. There is a subcentimeter left ovarian cyst, for which no specific follow-up is required. There is no right adnexal mass. Other: There is no ascites or free air. There is small fat containing umbilical hernia. Musculoskeletal: There is no acute osseous abnormality or aggressive osseous lesion. There is multilevel degenerative change of the lumbar spine with vacuum disc phenomenon at L3-L4 through L5-S1. Facet arthropathy is most  advanced at L4-L5 and L5-S1. There is a disc bulge at L4-L5 resulting in mild spinal canal stenosis. There is moderate right neural foraminal stenosis at this level. There is a left foraminal disc protrusion at L5-S1 resulting in severe left neural foraminal stenosis. IMPRESSION: 1. No acute findings in the abdomen or pelvis. 2. Moderate-sized hiatal hernia. 3. Degenerative changes in the lumbar spine as above resulting in mild spinal canal stenosis and moderate right neural foraminal stenosis at L4-L5 and severe left neural foraminal stenosis at L5-S1. 4. Colonic diverticulosis without evidence of acute diverticulitis. 5.  Aortic Atherosclerosis (ICD10-I70.0). Electronically Signed   By: Valetta Mole M.D.   On: 04/15/2021 11:17    Procedures Procedures    Medications Ordered in ED Medications  sodium chloride 0.9 % bolus 500 mL (500 mLs Intravenous New Bag/Given 04/15/21 0929)    And  0.9 %  sodium chloride infusion (has no administration in time range)  HYDROcodone-acetaminophen (NORCO/VICODIN) 5-325 MG per tablet 1 tablet (has no administration in time range)  HYDROmorphone (DILAUDID) injection 0.5 mg (0.5 mg Intravenous Given 04/15/21 0931)  ondansetron (ZOFRAN) injection 4 mg (4 mg Intravenous Given 04/15/21  0930)  iohexol (OMNIPAQUE) 300 MG/ML solution 80 mL (80 mLs Intravenous Contrast Given 04/15/21 1051)    ED Course/ Medical Decision Making/ A&P Clinical Course as of 04/15/21 1508  Tue Apr 15, 2021  9518 CBC and metabolic panel normal.  Lipase normal.  Urinalysis is pending [JK]  1223 CT scan images and radiology report reviewed.  Patient does not have any acute abdominal pelvic pathology. [AC]  1660 CT scan does show degenerative changes in the lumbar spine resulting in mild spinal canal stenosis and moderate right and left neuroforaminal stenosis [JK]    Clinical Course User Index [JK] Dorie Rank, MD                            Patient presents to the ER for evaluation of back pain.  Patient's pain was severe in nature.  Consider the possibility of vascular etiology such as abdominal aortic aneurysm as well as renal colic.  Laboratory tests are reassuring.  No signs of hepatitis or pancreatitis.  CT scan does not show any evidence of acute abnormality other than an findings consistent with generative changes and neuroforaminal stenosis.  Patient was treated with IV narcotic pain medications.  Her symptoms have improved at this time and she is more comfortable.  I suspect her symptoms are related to radiculopathy from the neuroforaminal stenosis.  No signs of acute neurological compromise requiring emergent surgical intervention.  Will discharge home on a course of pain medications and also recommend close outpatient follow-up with neurosurgery.       Final Clinical Impression(s) / ED Diagnoses Final diagnoses:  Sciatica, unspecified laterality    Rx / DC Orders ED Discharge Orders          Ordered    HYDROcodone-acetaminophen (NORCO/VICODIN) 5-325 MG tablet  Every 6 hours PRN        04/15/21 1506    predniSONE (DELTASONE) 10 MG tablet        04/15/21 1506              Dorie Rank, MD 04/15/21 [phone]

## 2021-04-15 NOTE — ED Triage Notes (Signed)
Pt from home c/o of back pain for the past 2 days. Pt states that pain is continuous and in her lower back. Pt states that she does yoga and she has had trouble with sciatica in the past but Yoga relieved the pain.

## 2021-04-15 NOTE — ED Notes (Signed)
Patient verbalizes understanding of discharge instructions. Opportunity for questioning and answers were provided. Patient discharged from ED.  °

## 2021-04-15 NOTE — ED Notes (Signed)
Patient transported to CT 

## 2021-04-15 NOTE — Discharge Instructions (Addendum)
Take the medications as prescribed for pain.  You can also try over-the-counter lidocaine patches for pain and discomfort  (one brand name is salonpas) consider following up with a neurosurgeon for further evaluation

## 2021-04-18 ENCOUNTER — Ambulatory Visit: Payer: Medicare HMO

## 2021-04-18 ENCOUNTER — Ambulatory Visit
Admission: RE | Admit: 2021-04-18 | Discharge: 2021-04-18 | Disposition: A | Discharge status: Home / Self Care | Payer: Medicare HMO | Source: Ambulatory Visit | Attending: Nurse Practitioner | Admitting: Nurse Practitioner

## 2021-04-18 DIAGNOSIS — R922 Inconclusive mammogram: Secondary | ICD-10-CM | POA: Diagnosis not present

## 2021-04-18 DIAGNOSIS — N644 Mastodynia: Secondary | ICD-10-CM | POA: Diagnosis not present

## 2021-04-21 DIAGNOSIS — M5431 Sciatica, right side: Secondary | ICD-10-CM | POA: Diagnosis not present

## 2021-04-21 DIAGNOSIS — M9903 Segmental and somatic dysfunction of lumbar region: Secondary | ICD-10-CM | POA: Diagnosis not present

## 2021-04-22 ENCOUNTER — Ambulatory Visit (INDEPENDENT_AMBULATORY_CARE_PROVIDER_SITE_OTHER): Payer: Medicare HMO | Admitting: Physician Assistant

## 2021-04-22 ENCOUNTER — Ambulatory Visit: Payer: Medicare HMO | Admitting: Physician Assistant

## 2021-04-22 ENCOUNTER — Encounter: Payer: Self-pay | Admitting: Physician Assistant

## 2021-04-22 ENCOUNTER — Other Ambulatory Visit: Payer: Self-pay

## 2021-04-22 DIAGNOSIS — D239 Other benign neoplasm of skin, unspecified: Secondary | ICD-10-CM

## 2021-04-22 DIAGNOSIS — D235 Other benign neoplasm of skin of trunk: Secondary | ICD-10-CM | POA: Diagnosis not present

## 2021-04-22 DIAGNOSIS — L988 Other specified disorders of the skin and subcutaneous tissue: Secondary | ICD-10-CM | POA: Diagnosis not present

## 2021-04-22 DIAGNOSIS — D2372 Other benign neoplasm of skin of left lower limb, including hip: Secondary | ICD-10-CM | POA: Diagnosis not present

## 2021-04-22 NOTE — Patient Instructions (Signed)

## 2021-04-23 DIAGNOSIS — M5431 Sciatica, right side: Secondary | ICD-10-CM | POA: Diagnosis not present

## 2021-04-23 DIAGNOSIS — M9903 Segmental and somatic dysfunction of lumbar region: Secondary | ICD-10-CM | POA: Diagnosis not present

## 2021-04-24 DIAGNOSIS — M5431 Sciatica, right side: Secondary | ICD-10-CM | POA: Diagnosis not present

## 2021-04-24 DIAGNOSIS — M9903 Segmental and somatic dysfunction of lumbar region: Secondary | ICD-10-CM | POA: Diagnosis not present

## 2021-04-28 ENCOUNTER — Encounter: Payer: Self-pay | Admitting: Physician Assistant

## 2021-04-28 DIAGNOSIS — M9903 Segmental and somatic dysfunction of lumbar region: Secondary | ICD-10-CM | POA: Diagnosis not present

## 2021-04-28 DIAGNOSIS — M5431 Sciatica, right side: Secondary | ICD-10-CM | POA: Diagnosis not present

## 2021-04-28 NOTE — Progress Notes (Signed)
° °  Follow-Up Visit   Subjective  Natalie Bradshaw is a 76 y.o. female who presents for the following: Procedure (Here for ws x 2 - left thigh anterior & left chest).   The following portions of the chart were reviewed this encounter and updated as appropriate:  Tobacco   Allergies   Meds   Problems   Med Hx   Surg Hx   Fam Hx       Objective  Well appearing patient in no apparent distress; mood and affect are within normal limits.  A focused examination was performed including chest and left thigh. Relevant physical exam findings are noted in the Assessment and Plan.  Left Chest, Left Thigh - Anterior Pink macules   Assessment & Plan  Dysplastic nevus (2) Left Thigh - Anterior; Left Chest  Epidermal / dermal shaving - Left Thigh - Anterior  Lesion diameter (cm):  1.5 Informed consent: discussed and consent obtained   Timeout: patient name, date of birth, surgical site, and procedure verified   Procedure prep:  Patient was prepped and draped in usual sterile fashion Prep type:  Chlorhexidine Anesthesia: the lesion was anesthetized in a standard fashion   Anesthetic:  1% lidocaine w/ epinephrine 1-100,000 local infiltration Instrument used: DermaBlade   Hemostasis achieved with: aluminum chloride   Outcome: patient tolerated procedure well   Post-procedure details: sterile dressing applied and wound care instructions given   Dressing type: petrolatum gauze, petrolatum and bandage    Epidermal / dermal shaving - Left Chest  Lesion diameter (cm):  1.5 Informed consent: discussed and consent obtained   Timeout: patient name, date of birth, surgical site, and procedure verified   Procedure prep:  Patient was prepped and draped in usual sterile fashion Prep type:  Chlorhexidine Anesthesia: the lesion was anesthetized in a standard fashion   Anesthetic:  1% lidocaine w/ epinephrine 1-100,000 local infiltration Instrument used: DermaBlade   Hemostasis achieved with: aluminum  chloride   Outcome: patient tolerated procedure well   Post-procedure details: sterile dressing applied and wound care instructions given   Dressing type: petrolatum gauze, petrolatum and bandage    Specimen 1 - Surgical pathology Differential Diagnosis: atypia (WS) [id number] Check Margins: yes  Specimen 2 - Surgical pathology Differential Diagnosis: atypia (WS) [id number] Check Margins: yes    I, Lizzete Gough, PA-C, have reviewed all documentation's for this visit.  The documentation on 04/28/21 for the exam, diagnosis, procedures and orders are all accurate and complete.

## 2021-04-30 DIAGNOSIS — M5431 Sciatica, right side: Secondary | ICD-10-CM | POA: Diagnosis not present

## 2021-04-30 DIAGNOSIS — M9903 Segmental and somatic dysfunction of lumbar region: Secondary | ICD-10-CM | POA: Diagnosis not present

## 2021-05-21 DIAGNOSIS — M9903 Segmental and somatic dysfunction of lumbar region: Secondary | ICD-10-CM | POA: Diagnosis not present

## 2021-05-21 DIAGNOSIS — M5431 Sciatica, right side: Secondary | ICD-10-CM | POA: Diagnosis not present

## 2021-05-28 DIAGNOSIS — M0609 Rheumatoid arthritis without rheumatoid factor, multiple sites: Secondary | ICD-10-CM | POA: Diagnosis not present

## 2021-06-11 DIAGNOSIS — M5431 Sciatica, right side: Secondary | ICD-10-CM | POA: Diagnosis not present

## 2021-06-11 DIAGNOSIS — M9903 Segmental and somatic dysfunction of lumbar region: Secondary | ICD-10-CM | POA: Diagnosis not present

## 2021-06-12 ENCOUNTER — Other Ambulatory Visit: Payer: Self-pay | Admitting: Physician Assistant

## 2021-06-12 DIAGNOSIS — L988 Other specified disorders of the skin and subcutaneous tissue: Secondary | ICD-10-CM

## 2021-07-02 DIAGNOSIS — M5431 Sciatica, right side: Secondary | ICD-10-CM | POA: Diagnosis not present

## 2021-07-02 DIAGNOSIS — M9903 Segmental and somatic dysfunction of lumbar region: Secondary | ICD-10-CM | POA: Diagnosis not present

## 2021-07-23 DIAGNOSIS — Z6829 Body mass index (BMI) 29.0-29.9, adult: Secondary | ICD-10-CM | POA: Diagnosis not present

## 2021-07-23 DIAGNOSIS — M461 Sacroiliitis, not elsewhere classified: Secondary | ICD-10-CM | POA: Diagnosis not present

## 2021-08-03 DIAGNOSIS — J019 Acute sinusitis, unspecified: Secondary | ICD-10-CM | POA: Diagnosis not present

## 2021-08-12 DIAGNOSIS — M9903 Segmental and somatic dysfunction of lumbar region: Secondary | ICD-10-CM | POA: Diagnosis not present

## 2021-08-12 DIAGNOSIS — M5431 Sciatica, right side: Secondary | ICD-10-CM | POA: Diagnosis not present

## 2021-08-14 DIAGNOSIS — M9903 Segmental and somatic dysfunction of lumbar region: Secondary | ICD-10-CM | POA: Diagnosis not present

## 2021-08-14 DIAGNOSIS — M5431 Sciatica, right side: Secondary | ICD-10-CM | POA: Diagnosis not present

## 2021-08-15 DIAGNOSIS — Z79899 Other long term (current) drug therapy: Secondary | ICD-10-CM | POA: Diagnosis not present

## 2021-08-15 DIAGNOSIS — R7612 Nonspecific reaction to cell mediated immunity measurement of gamma interferon antigen response without active tuberculosis: Secondary | ICD-10-CM | POA: Diagnosis not present

## 2021-08-15 DIAGNOSIS — M0609 Rheumatoid arthritis without rheumatoid factor, multiple sites: Secondary | ICD-10-CM | POA: Diagnosis not present

## 2021-08-15 DIAGNOSIS — M5431 Sciatica, right side: Secondary | ICD-10-CM | POA: Diagnosis not present

## 2021-08-15 DIAGNOSIS — Z6829 Body mass index (BMI) 29.0-29.9, adult: Secondary | ICD-10-CM | POA: Diagnosis not present

## 2021-08-15 DIAGNOSIS — M1991 Primary osteoarthritis, unspecified site: Secondary | ICD-10-CM | POA: Diagnosis not present

## 2021-08-15 DIAGNOSIS — E663 Overweight: Secondary | ICD-10-CM | POA: Diagnosis not present

## 2021-08-18 DIAGNOSIS — M9903 Segmental and somatic dysfunction of lumbar region: Secondary | ICD-10-CM | POA: Diagnosis not present

## 2021-08-18 DIAGNOSIS — M5431 Sciatica, right side: Secondary | ICD-10-CM | POA: Diagnosis not present

## 2021-08-19 DIAGNOSIS — M461 Sacroiliitis, not elsewhere classified: Secondary | ICD-10-CM | POA: Diagnosis not present

## 2021-09-02 DIAGNOSIS — N390 Urinary tract infection, site not specified: Secondary | ICD-10-CM | POA: Diagnosis not present

## 2021-09-11 ENCOUNTER — Ambulatory Visit: Payer: Medicare HMO | Admitting: Physician Assistant

## 2021-09-11 ENCOUNTER — Encounter: Payer: Self-pay | Admitting: Physician Assistant

## 2021-09-11 DIAGNOSIS — L570 Actinic keratosis: Secondary | ICD-10-CM | POA: Diagnosis not present

## 2021-09-11 DIAGNOSIS — D2271 Melanocytic nevi of right lower limb, including hip: Secondary | ICD-10-CM | POA: Diagnosis not present

## 2021-09-11 DIAGNOSIS — D485 Neoplasm of uncertain behavior of skin: Secondary | ICD-10-CM

## 2021-09-11 DIAGNOSIS — Z1283 Encounter for screening for malignant neoplasm of skin: Secondary | ICD-10-CM | POA: Diagnosis not present

## 2021-09-11 NOTE — Progress Notes (Signed)
   Follow-Up Visit   Subjective  Natalie Bradshaw is a 76 y.o. female who presents for the following: Follow-up (Patient here today for 6 month follow up, no concerns today. 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 No family history of atypical moles, melanoma or non mole skin cancer. ).   The following portions of the chart were reviewed this encounter and updated as appropriate:  Tobacco  Allergies  Meds  Problems  Med Hx  Surg Hx  Fam Hx      Objective  Well appearing patient in no apparent distress; mood and affect are within normal limits.  A full examination was performed including scalp, head, eyes, ears, nose, lips, neck, chest, axillae, abdomen, back, buttocks, bilateral upper extremities, bilateral lower extremities, hands, feet, fingers, toes, fingernails, and toenails. All findings within normal limits unless otherwise noted below.  Full body skin examination - No signs of NMSC noted at the time of the visit.   Right Thigh - Anterior Bichromic dark nested macule.            Right Root of Nose Erythematous patches with gritty scale.   Assessment & Plan  Encounter for screening for malignant neoplasm of skin  Yearly skin examinations  Neoplasm of uncertain behavior of skin Right Thigh - Anterior  Skin / nail biopsy Type of biopsy: tangential   Informed consent: discussed and consent obtained   Timeout: patient name, date of birth, surgical site, and procedure verified   Anesthesia: the lesion was anesthetized in a standard fashion   Anesthetic:  1% lidocaine w/ epinephrine 1-100,000 local infiltration Instrument used: flexible razor blade   Hemostasis achieved with: aluminum chloride and electrodesiccation   Outcome: patient tolerated procedure well   Post-procedure details: wound care instructions given    Specimen 1 - Surgical pathology Differential Diagnosis: r/o atypia   Check Margins: yes  AK (actinic keratosis) Right  Root of Nose  Destruction of lesion - Right Root of Nose Complexity: simple   Destruction method: cryotherapy   Informed consent: discussed and consent obtained   Timeout:  patient name, date of birth, surgical site, and procedure verified Lesion destroyed using liquid nitrogen: Yes   Cryotherapy cycles:  3 Outcome: patient tolerated procedure well with no complications    Related Medications Fluorouracil (TOLAK) 4 % CREA Apply to affected area qhs Monday- Sunday x 2 weeks    I, Hayven Fatima, PA-C, have reviewed all documentation's for this visit.  The documentation on 09/11/21 for the exam, diagnosis, procedures and orders are all accurate and complete.

## 2021-09-11 NOTE — Patient Instructions (Addendum)
[facility]   Biopsy, Surgery (Curettage) & Surgery (Excision) Aftercare    Instructions  1. Okay to remove bandage in 24 hours  2. Wash area with soap and water  3. Apply Vaseline to area twice daily until healed (Not Neosporin)  4. Okay to cover with a Band-Aid to decrease the chance of infection or prevent irritation from clothing; also it's okay to uncover lesion at home.  5. Suture instructions: return to our office in 7-10 or 10-14 days for a nurse visit for suture removal. Variable healing with sutures, if pain or itching occurs call our office. It's okay to shower or bathe 24 hours after sutures are given.  6. The following risks may occur after a biopsy, curettage or excision: bleeding, scarring, discoloration, recurrence, infection (redness, yellow drainage, pain or swelling).  7. For questions, concerns and results call our office at [place] before 4pm & Friday before 3pm. Biopsy results will be available in 1 week.

## 2021-09-12 DIAGNOSIS — D649 Anemia, unspecified: Secondary | ICD-10-CM | POA: Diagnosis not present

## 2021-09-18 ENCOUNTER — Telehealth: Payer: Self-pay

## 2021-09-18 NOTE — Telephone Encounter (Signed)
Patient returned call about pathology results.

## 2021-09-18 NOTE — Telephone Encounter (Signed)
Phone call to patient with her pathology results. Patient aware of results.  

## 2021-09-18 NOTE — Telephone Encounter (Signed)
Phone call to patient with her pathology results. Voicemail left for patient to give the office a call back.  

## 2021-09-18 NOTE — Telephone Encounter (Signed)
-----   Message from Warren Danes, [place] sent at 09/17/2021  8:49 AM EDT ----- ws

## 2021-10-03 DIAGNOSIS — M461 Sacroiliitis, not elsewhere classified: Secondary | ICD-10-CM | POA: Diagnosis not present

## 2021-10-03 DIAGNOSIS — Z6828 Body mass index (BMI) 28.0-28.9, adult: Secondary | ICD-10-CM | POA: Diagnosis not present

## 2021-10-30 DIAGNOSIS — M5451 Vertebrogenic low back pain: Secondary | ICD-10-CM | POA: Diagnosis not present

## 2021-11-04 ENCOUNTER — Telehealth: Payer: Self-pay | Admitting: Physician Assistant

## 2021-11-04 DIAGNOSIS — M5451 Vertebrogenic low back pain: Secondary | ICD-10-CM | POA: Diagnosis not present

## 2021-11-04 NOTE — Telephone Encounter (Signed)
Schedules for surgery w/[facility] for 8/30. Told her we will contact the [facility] and set something up for her

## 2021-11-11 DIAGNOSIS — M461 Sacroiliitis, not elsewhere classified: Secondary | ICD-10-CM | POA: Diagnosis not present

## 2021-11-14 DIAGNOSIS — Z79899 Other long term (current) drug therapy: Secondary | ICD-10-CM | POA: Diagnosis not present

## 2021-11-14 DIAGNOSIS — M0609 Rheumatoid arthritis without rheumatoid factor, multiple sites: Secondary | ICD-10-CM | POA: Diagnosis not present

## 2021-11-28 DIAGNOSIS — R69 Illness, unspecified: Secondary | ICD-10-CM | POA: Diagnosis not present

## 2021-11-28 DIAGNOSIS — Z0000 Encounter for general adult medical examination without abnormal findings: Secondary | ICD-10-CM | POA: Diagnosis not present

## 2021-11-28 DIAGNOSIS — R7301 Impaired fasting glucose: Secondary | ICD-10-CM | POA: Diagnosis not present

## 2021-11-28 DIAGNOSIS — Z5181 Encounter for therapeutic drug level monitoring: Secondary | ICD-10-CM | POA: Diagnosis not present

## 2021-11-28 DIAGNOSIS — E782 Mixed hyperlipidemia: Secondary | ICD-10-CM | POA: Diagnosis not present

## 2021-11-28 DIAGNOSIS — R509 Fever, unspecified: Secondary | ICD-10-CM | POA: Diagnosis not present

## 2021-12-02 DIAGNOSIS — R739 Hyperglycemia, unspecified: Secondary | ICD-10-CM | POA: Diagnosis not present

## 2021-12-02 DIAGNOSIS — N390 Urinary tract infection, site not specified: Secondary | ICD-10-CM | POA: Diagnosis not present

## 2021-12-02 DIAGNOSIS — R000 Tachycardia, unspecified: Secondary | ICD-10-CM | POA: Diagnosis not present

## 2021-12-03 ENCOUNTER — Encounter: Payer: Medicare HMO | Admitting: Physician Assistant

## 2021-12-05 DIAGNOSIS — E559 Vitamin D deficiency, unspecified: Secondary | ICD-10-CM | POA: Diagnosis not present

## 2021-12-12 DIAGNOSIS — E1165 Type 2 diabetes mellitus with hyperglycemia: Secondary | ICD-10-CM | POA: Diagnosis not present

## 2021-12-12 DIAGNOSIS — Z23 Encounter for immunization: Secondary | ICD-10-CM | POA: Diagnosis not present

## 2021-12-12 DIAGNOSIS — R350 Frequency of micturition: Secondary | ICD-10-CM | POA: Diagnosis not present

## 2021-12-15 DIAGNOSIS — L905 Scar conditions and fibrosis of skin: Secondary | ICD-10-CM | POA: Diagnosis not present

## 2021-12-15 DIAGNOSIS — D239 Other benign neoplasm of skin, unspecified: Secondary | ICD-10-CM | POA: Diagnosis not present

## 2021-12-17 DIAGNOSIS — U071 COVID-19: Secondary | ICD-10-CM | POA: Diagnosis not present

## 2021-12-24 DIAGNOSIS — H40012 Open angle with borderline findings, low risk, left eye: Secondary | ICD-10-CM | POA: Diagnosis not present

## 2021-12-24 DIAGNOSIS — E119 Type 2 diabetes mellitus without complications: Secondary | ICD-10-CM | POA: Diagnosis not present

## 2022-01-15 DIAGNOSIS — M5431 Sciatica, right side: Secondary | ICD-10-CM | POA: Diagnosis not present

## 2022-01-15 DIAGNOSIS — M9903 Segmental and somatic dysfunction of lumbar region: Secondary | ICD-10-CM | POA: Diagnosis not present

## 2022-01-19 DIAGNOSIS — M9903 Segmental and somatic dysfunction of lumbar region: Secondary | ICD-10-CM | POA: Diagnosis not present

## 2022-01-19 DIAGNOSIS — M5431 Sciatica, right side: Secondary | ICD-10-CM | POA: Diagnosis not present

## 2022-02-19 DIAGNOSIS — E663 Overweight: Secondary | ICD-10-CM | POA: Diagnosis not present

## 2022-02-19 DIAGNOSIS — Z6828 Body mass index (BMI) 28.0-28.9, adult: Secondary | ICD-10-CM | POA: Diagnosis not present

## 2022-02-19 DIAGNOSIS — R5383 Other fatigue: Secondary | ICD-10-CM | POA: Diagnosis not present

## 2022-02-19 DIAGNOSIS — D649 Anemia, unspecified: Secondary | ICD-10-CM | POA: Diagnosis not present

## 2022-02-19 DIAGNOSIS — Z79899 Other long term (current) drug therapy: Secondary | ICD-10-CM | POA: Diagnosis not present

## 2022-02-19 DIAGNOSIS — M1991 Primary osteoarthritis, unspecified site: Secondary | ICD-10-CM | POA: Diagnosis not present

## 2022-02-19 DIAGNOSIS — M0609 Rheumatoid arthritis without rheumatoid factor, multiple sites: Secondary | ICD-10-CM | POA: Diagnosis not present

## 2022-02-19 DIAGNOSIS — R7612 Nonspecific reaction to cell mediated immunity measurement of gamma interferon antigen response without active tuberculosis: Secondary | ICD-10-CM | POA: Diagnosis not present

## 2022-03-03 DIAGNOSIS — M9903 Segmental and somatic dysfunction of lumbar region: Secondary | ICD-10-CM | POA: Diagnosis not present

## 2022-03-03 DIAGNOSIS — M5431 Sciatica, right side: Secondary | ICD-10-CM | POA: Diagnosis not present

## 2022-03-13 DIAGNOSIS — M461 Sacroiliitis, not elsewhere classified: Secondary | ICD-10-CM | POA: Diagnosis not present

## 2022-03-17 ENCOUNTER — Ambulatory Visit: Payer: Medicare HMO | Admitting: Physician Assistant

## 2022-04-07 DIAGNOSIS — R69 Illness, unspecified: Secondary | ICD-10-CM | POA: Diagnosis not present

## 2022-04-07 DIAGNOSIS — R350 Frequency of micturition: Secondary | ICD-10-CM | POA: Diagnosis not present

## 2022-04-15 DIAGNOSIS — R69 Illness, unspecified: Secondary | ICD-10-CM | POA: Diagnosis not present

## 2022-04-15 DIAGNOSIS — R399 Unspecified symptoms and signs involving the genitourinary system: Secondary | ICD-10-CM | POA: Diagnosis not present

## 2022-04-15 DIAGNOSIS — N3289 Other specified disorders of bladder: Secondary | ICD-10-CM | POA: Diagnosis not present

## 2022-04-22 DIAGNOSIS — L570 Actinic keratosis: Secondary | ICD-10-CM | POA: Diagnosis not present

## 2022-04-22 DIAGNOSIS — Z411 Encounter for cosmetic surgery: Secondary | ICD-10-CM | POA: Diagnosis not present

## 2022-04-24 DIAGNOSIS — M47816 Spondylosis without myelopathy or radiculopathy, lumbar region: Secondary | ICD-10-CM | POA: Diagnosis not present

## 2022-04-24 DIAGNOSIS — Z6828 Body mass index (BMI) 28.0-28.9, adult: Secondary | ICD-10-CM | POA: Diagnosis not present

## 2022-04-24 DIAGNOSIS — M461 Sacroiliitis, not elsewhere classified: Secondary | ICD-10-CM | POA: Diagnosis not present

## 2022-04-28 DIAGNOSIS — R58 Hemorrhage, not elsewhere classified: Secondary | ICD-10-CM | POA: Diagnosis not present

## 2022-04-28 DIAGNOSIS — R102 Pelvic and perineal pain: Secondary | ICD-10-CM | POA: Diagnosis not present

## 2022-04-28 DIAGNOSIS — K5902 Outlet dysfunction constipation: Secondary | ICD-10-CM | POA: Diagnosis not present

## 2022-04-29 DIAGNOSIS — R3915 Urgency of urination: Secondary | ICD-10-CM | POA: Diagnosis not present

## 2022-04-29 DIAGNOSIS — R102 Pelvic and perineal pain: Secondary | ICD-10-CM | POA: Diagnosis not present

## 2022-04-29 DIAGNOSIS — R351 Nocturia: Secondary | ICD-10-CM | POA: Diagnosis not present

## 2022-04-29 DIAGNOSIS — N3020 Other chronic cystitis without hematuria: Secondary | ICD-10-CM | POA: Diagnosis not present

## 2022-05-05 DIAGNOSIS — K921 Melena: Secondary | ICD-10-CM | POA: Diagnosis not present

## 2022-05-05 DIAGNOSIS — N950 Postmenopausal bleeding: Secondary | ICD-10-CM | POA: Diagnosis not present

## 2022-05-05 DIAGNOSIS — R102 Pelvic and perineal pain: Secondary | ICD-10-CM | POA: Diagnosis not present

## 2022-05-12 DIAGNOSIS — E1165 Type 2 diabetes mellitus with hyperglycemia: Secondary | ICD-10-CM | POA: Diagnosis not present

## 2022-05-12 DIAGNOSIS — E119 Type 2 diabetes mellitus without complications: Secondary | ICD-10-CM | POA: Diagnosis not present

## 2022-05-12 DIAGNOSIS — R102 Pelvic and perineal pain: Secondary | ICD-10-CM | POA: Diagnosis not present

## 2022-05-19 DIAGNOSIS — M0609 Rheumatoid arthritis without rheumatoid factor, multiple sites: Secondary | ICD-10-CM | POA: Diagnosis not present

## 2022-05-21 DIAGNOSIS — D649 Anemia, unspecified: Secondary | ICD-10-CM | POA: Diagnosis not present

## 2022-05-23 IMAGING — MG DIGITAL DIAGNOSTIC BILAT W/ TOMO W/ CAD
6 of 10 series · 6 of 30 positions shown · non-contrast
Comparison: None.

CLINICAL DATA: 75-year-old female with focal, improving right
breast pain.

EXAM:
DIGITAL DIAGNOSTIC BILATERAL MAMMOGRAM WITH TOMOSYNTHESIS AND CAD;
ULTRASOUND RIGHT BREAST LIMITED
TECHNIQUE: Bilateral digital diagnostic mammography and breast tomosynthesis
was performed. The images were evaluated with computer-aided
detection.; Targeted ultrasound examination of the right breast was
performed

[R CC synth-2D]
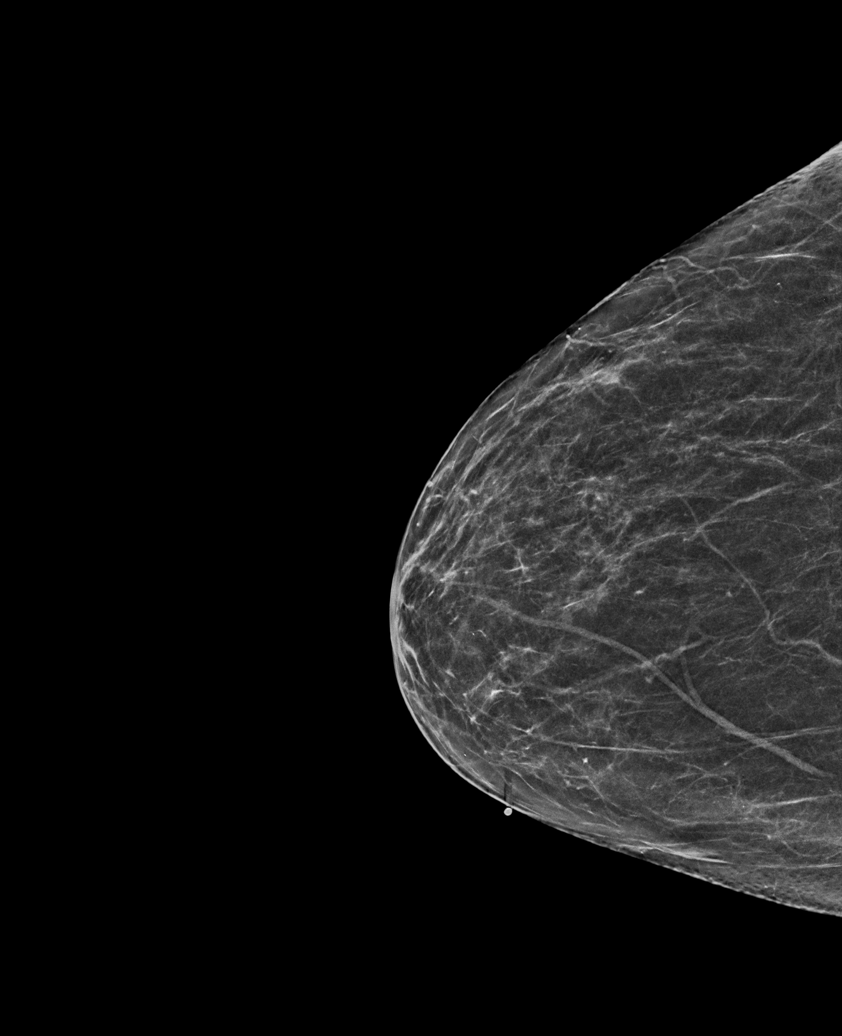

[R MLO synth-2D]
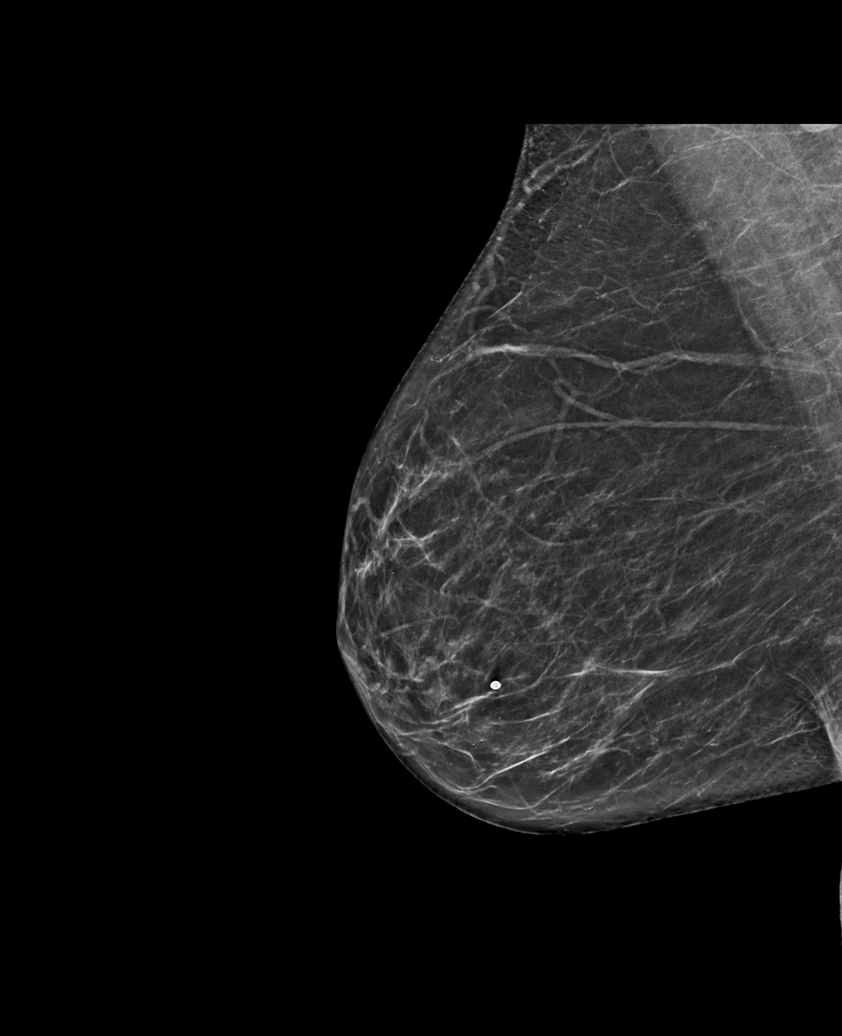

[R TAN synth-2D]
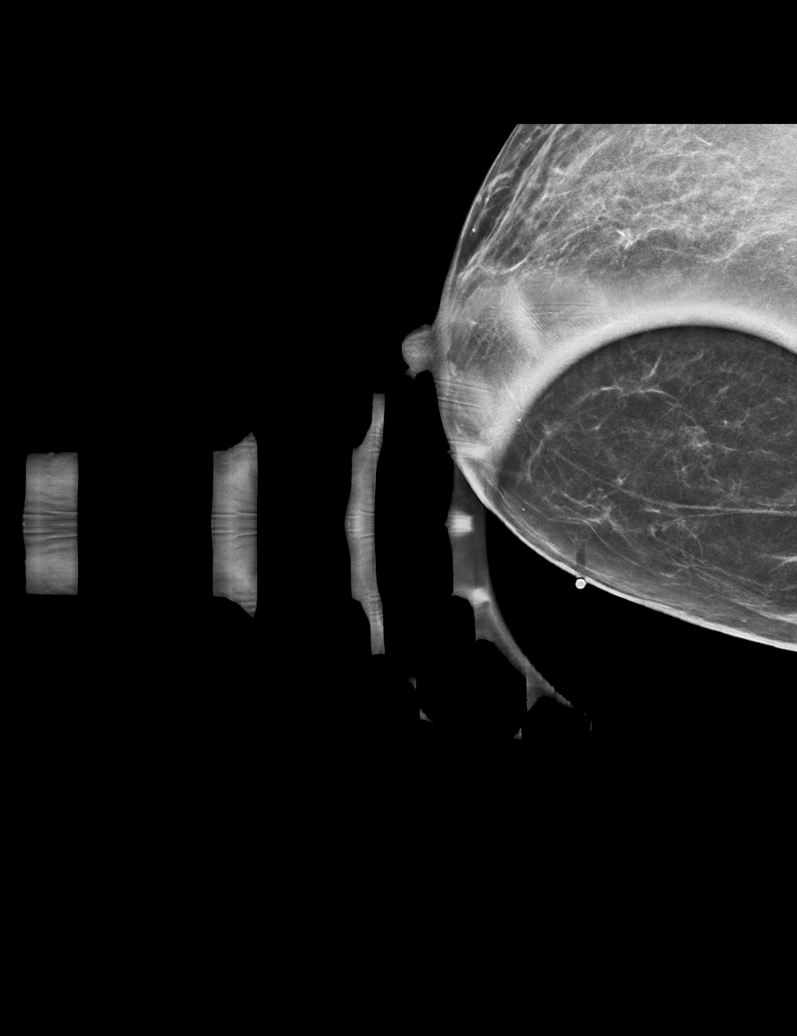

[L MLO synth-2D]
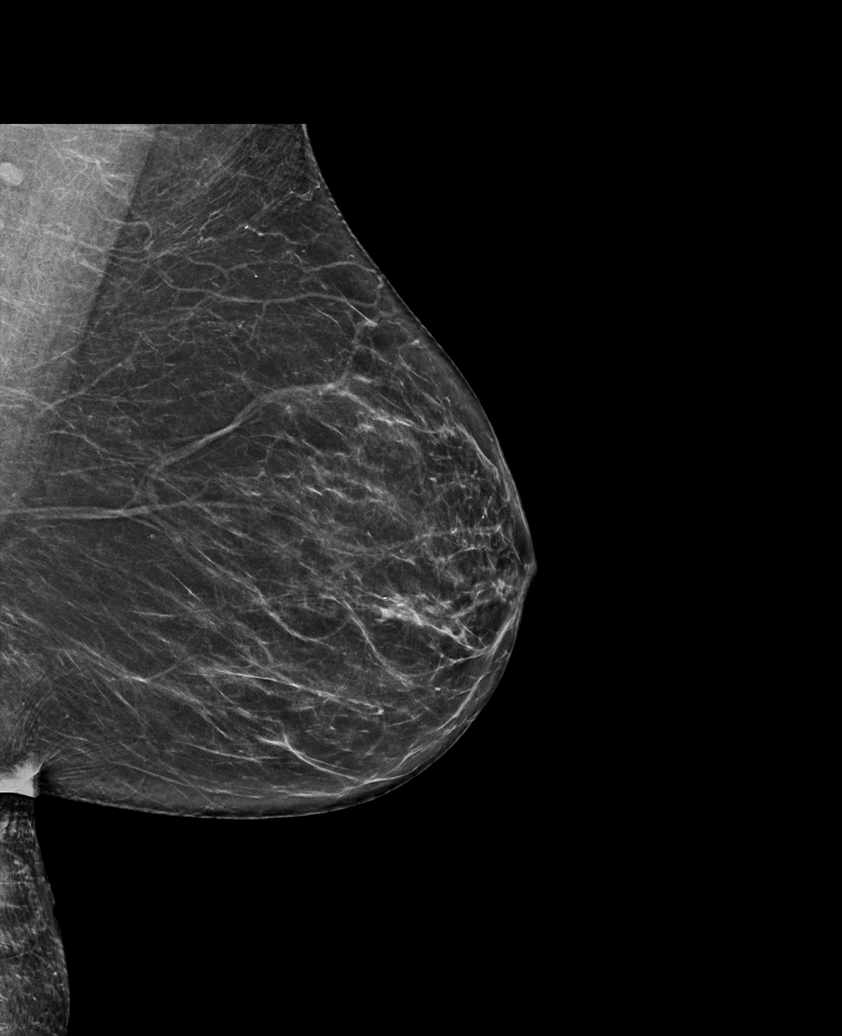

[L CC synth-2D]
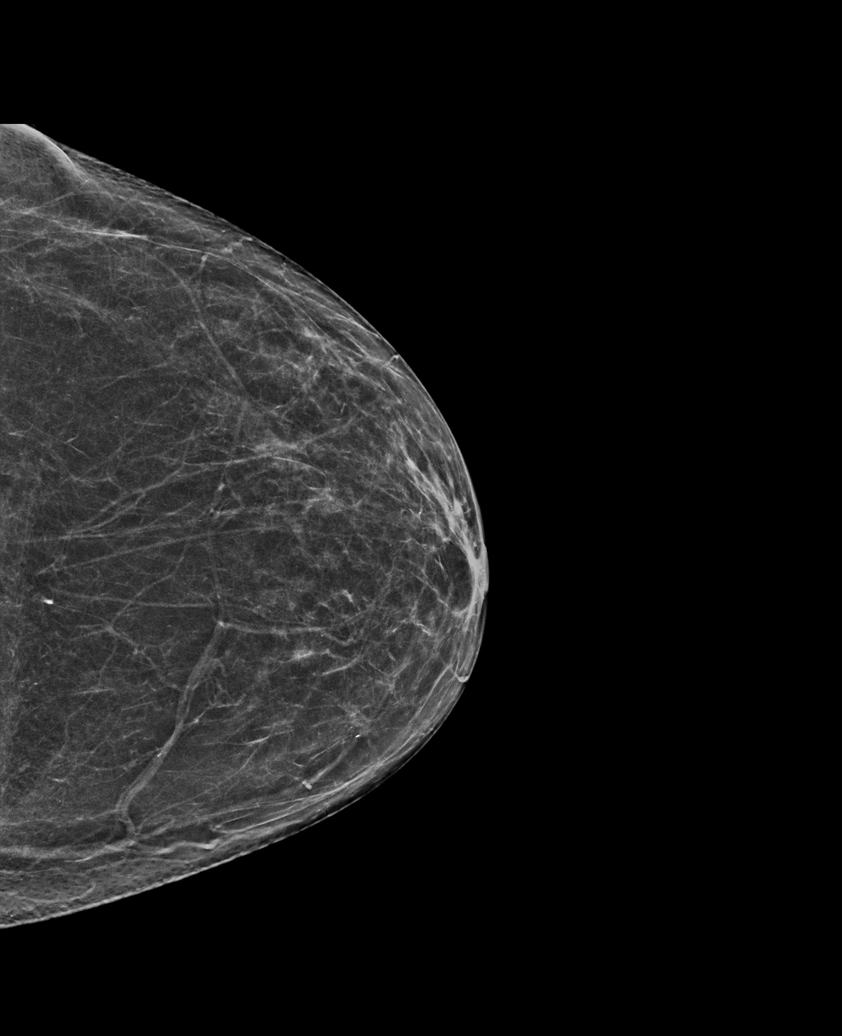

[R MLO tomo · tomo slice 33/66.0]
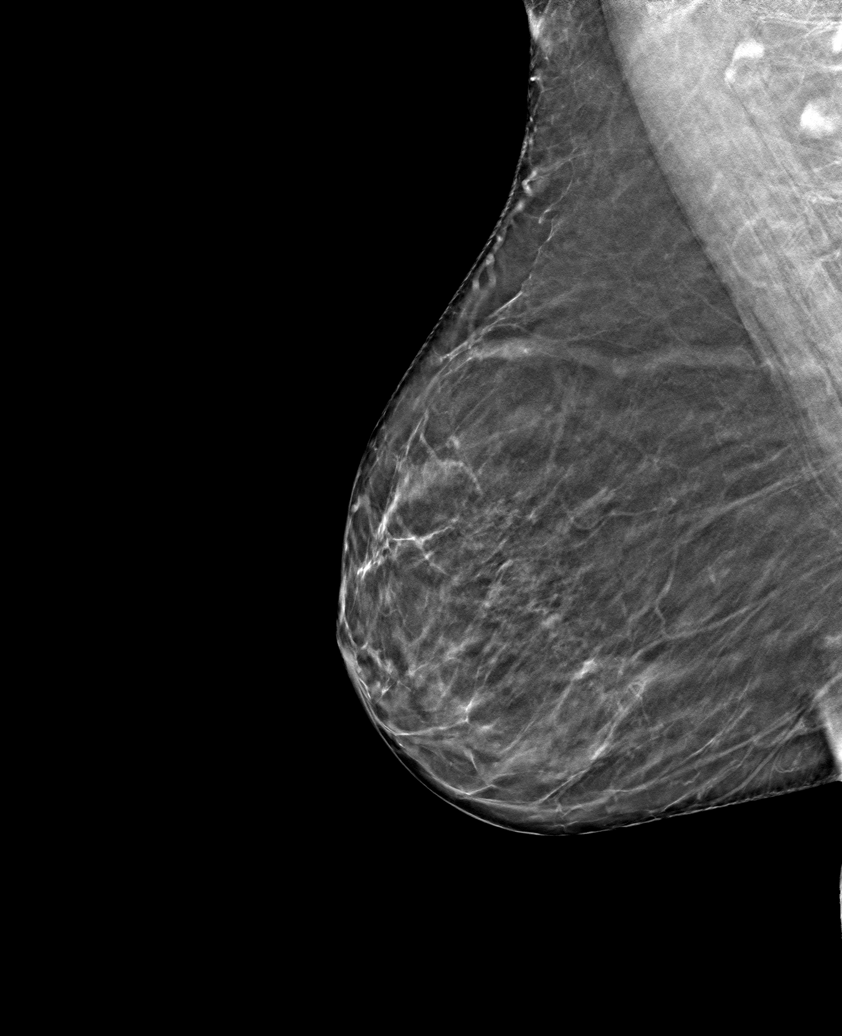

[6 of 30 positions shown; findings below may reference images not displayed]

ACR Breast Density Category b: There are scattered areas of
fibroglandular density.
FINDINGS: Radiopaque BB was placed at the site of the patient's palpable lump
in the medial right breast. No focal or suspicious mammographic
findings are seen deep to the radiopaque BB or within the remainder
of either breast.

Targeted ultrasound is performed, showing normal fibroglandular
tissue without focal or suspicious sonographic abnormality.
Evaluation of the medial right breast was performed.
IMPRESSION: 1. No mammographic evidence of malignancy in either breast.
2. Unremarkable ultrasound evaluation of the right breast.

RECOMMENDATION:
1. Clinical follow-up recommended for the painful area of concern in
the right breast. Any further workup should be based on clinical
grounds.
2.  Screening mammogram in one year.(Code:[id number])

I have discussed the findings and recommendations with the patient.
If applicable, a reminder letter will be sent to the patient
regarding the next appointment.

BI-RADS CATEGORY  1: Negative.

## 2022-05-25 DIAGNOSIS — Z86010 Personal history of colonic polyps: Secondary | ICD-10-CM | POA: Diagnosis not present

## 2022-05-25 DIAGNOSIS — R195 Other fecal abnormalities: Secondary | ICD-10-CM | POA: Diagnosis not present

## 2022-05-25 DIAGNOSIS — K625 Hemorrhage of anus and rectum: Secondary | ICD-10-CM | POA: Diagnosis not present

## 2022-05-25 DIAGNOSIS — D649 Anemia, unspecified: Secondary | ICD-10-CM | POA: Diagnosis not present

## 2022-05-29 DIAGNOSIS — L570 Actinic keratosis: Secondary | ICD-10-CM | POA: Diagnosis not present

## 2022-06-11 DIAGNOSIS — D509 Iron deficiency anemia, unspecified: Secondary | ICD-10-CM | POA: Diagnosis not present

## 2022-06-13 DIAGNOSIS — N3000 Acute cystitis without hematuria: Secondary | ICD-10-CM | POA: Diagnosis not present

## 2022-06-13 DIAGNOSIS — R300 Dysuria: Secondary | ICD-10-CM | POA: Diagnosis not present

## 2022-06-16 DIAGNOSIS — K449 Diaphragmatic hernia without obstruction or gangrene: Secondary | ICD-10-CM | POA: Diagnosis not present

## 2022-06-16 DIAGNOSIS — K222 Esophageal obstruction: Secondary | ICD-10-CM | POA: Diagnosis not present

## 2022-06-16 DIAGNOSIS — K648 Other hemorrhoids: Secondary | ICD-10-CM | POA: Diagnosis not present

## 2022-06-16 DIAGNOSIS — D123 Benign neoplasm of transverse colon: Secondary | ICD-10-CM | POA: Diagnosis not present

## 2022-06-16 DIAGNOSIS — K3189 Other diseases of stomach and duodenum: Secondary | ICD-10-CM | POA: Diagnosis not present

## 2022-06-16 DIAGNOSIS — K5730 Diverticulosis of large intestine without perforation or abscess without bleeding: Secondary | ICD-10-CM | POA: Diagnosis not present

## 2022-06-16 DIAGNOSIS — D509 Iron deficiency anemia, unspecified: Secondary | ICD-10-CM | POA: Diagnosis not present

## 2022-06-16 DIAGNOSIS — K2930 Chronic superficial gastritis without bleeding: Secondary | ICD-10-CM | POA: Diagnosis not present

## 2022-06-16 DIAGNOSIS — D122 Benign neoplasm of ascending colon: Secondary | ICD-10-CM | POA: Diagnosis not present

## 2022-06-18 DIAGNOSIS — D123 Benign neoplasm of transverse colon: Secondary | ICD-10-CM | POA: Diagnosis not present

## 2022-06-18 DIAGNOSIS — K2930 Chronic superficial gastritis without bleeding: Secondary | ICD-10-CM | POA: Diagnosis not present

## 2022-06-23 DIAGNOSIS — D649 Anemia, unspecified: Secondary | ICD-10-CM | POA: Diagnosis not present

## 2022-06-30 DIAGNOSIS — R5383 Other fatigue: Secondary | ICD-10-CM | POA: Diagnosis not present

## 2022-06-30 DIAGNOSIS — R350 Frequency of micturition: Secondary | ICD-10-CM | POA: Diagnosis not present

## 2022-07-09 DIAGNOSIS — D500 Iron deficiency anemia secondary to blood loss (chronic): Secondary | ICD-10-CM | POA: Diagnosis not present

## 2022-07-09 DIAGNOSIS — K449 Diaphragmatic hernia without obstruction or gangrene: Secondary | ICD-10-CM | POA: Diagnosis not present

## 2022-07-09 DIAGNOSIS — Z8249 Family history of ischemic heart disease and other diseases of the circulatory system: Secondary | ICD-10-CM | POA: Diagnosis not present

## 2022-07-09 DIAGNOSIS — K259 Gastric ulcer, unspecified as acute or chronic, without hemorrhage or perforation: Secondary | ICD-10-CM | POA: Diagnosis not present

## 2022-07-15 ENCOUNTER — Other Ambulatory Visit: Payer: Self-pay | Admitting: Family

## 2022-07-15 DIAGNOSIS — D649 Anemia, unspecified: Secondary | ICD-10-CM

## 2022-07-17 ENCOUNTER — Encounter: Payer: Self-pay | Admitting: Family

## 2022-07-17 ENCOUNTER — Inpatient Hospital Stay: Payer: Medicare HMO | Attending: Hematology & Oncology

## 2022-07-17 ENCOUNTER — Inpatient Hospital Stay (HOSPITAL_BASED_OUTPATIENT_CLINIC_OR_DEPARTMENT_OTHER): Payer: Medicare HMO | Admitting: Family

## 2022-07-17 ENCOUNTER — Other Ambulatory Visit: Payer: Self-pay

## 2022-07-17 VITALS — BP 149/65 | HR 60 | Temp 97.9°F | Resp 20 | Ht 68.0 in | Wt 189.1 lb

## 2022-07-17 DIAGNOSIS — R350 Frequency of micturition: Secondary | ICD-10-CM | POA: Diagnosis not present

## 2022-07-17 DIAGNOSIS — R399 Unspecified symptoms and signs involving the genitourinary system: Secondary | ICD-10-CM

## 2022-07-17 DIAGNOSIS — D649 Anemia, unspecified: Secondary | ICD-10-CM

## 2022-07-17 DIAGNOSIS — K449 Diaphragmatic hernia without obstruction or gangrene: Secondary | ICD-10-CM | POA: Diagnosis not present

## 2022-07-17 DIAGNOSIS — D500 Iron deficiency anemia secondary to blood loss (chronic): Secondary | ICD-10-CM

## 2022-07-17 DIAGNOSIS — D509 Iron deficiency anemia, unspecified: Secondary | ICD-10-CM | POA: Diagnosis not present

## 2022-07-17 DIAGNOSIS — M545 Low back pain, unspecified: Secondary | ICD-10-CM | POA: Diagnosis not present

## 2022-07-17 DIAGNOSIS — D6859 Other primary thrombophilia: Secondary | ICD-10-CM

## 2022-07-17 DIAGNOSIS — Z8249 Family history of ischemic heart disease and other diseases of the circulatory system: Secondary | ICD-10-CM

## 2022-07-17 LAB — CBC WITH DIFFERENTIAL (CANCER CENTER ONLY)
Abs Immature Granulocytes: 0.07 10*3/uL (ref 0.00–0.07)
Basophils Absolute: 0 10*3/uL (ref 0.0–0.1)
Basophils Relative: 0 %
Eosinophils Absolute: 0.1 10*3/uL (ref 0.0–0.5)
Eosinophils Relative: 2 %
HCT: 39.5 % (ref 36.0–46.0)
Hemoglobin: 12.4 g/dL (ref 12.0–15.0)
Immature Granulocytes: 1 %
Lymphocytes Relative: 27 %
Lymphs Abs: 1.4 10*3/uL (ref 0.7–4.0)
MCH: 26.9 pg (ref 26.0–34.0)
MCHC: 31.4 g/dL (ref 30.0–36.0)
MCV: 85.7 fL (ref 80.0–100.0)
Monocytes Absolute: 0.4 10*3/uL (ref 0.1–1.0)
Monocytes Relative: 7 %
Neutro Abs: 3.3 10*3/uL (ref 1.7–7.7)
Neutrophils Relative %: 63 %
Platelet Count: 287 10*3/uL (ref 150–400)
RBC: 4.61 MIL/uL (ref 3.87–5.11)
RDW: 24.7 % — ABNORMAL HIGH (ref 11.5–15.5)
WBC Count: 5.3 10*3/uL (ref 4.0–10.5)
nRBC: 0 % (ref 0.0–0.2)

## 2022-07-17 LAB — CMP (CANCER CENTER ONLY)
ALT: 16 U/L (ref 0–44)
AST: 20 U/L (ref 15–41)
Albumin: 4.2 g/dL (ref 3.5–5.0)
Alkaline Phosphatase: 43 U/L (ref 38–126)
Anion gap: 8 (ref 5–15)
BUN: 16 mg/dL (ref 8–23)
CO2: 29 mmol/L (ref 22–32)
Calcium: 9.6 mg/dL (ref 8.9–10.3)
Chloride: 104 mmol/L (ref 98–111)
Creatinine: 1.08 mg/dL — ABNORMAL HIGH (ref 0.44–1.00)
GFR, Estimated: 53 mL/min — ABNORMAL LOW (ref >60)
Glucose, Bld: 116 mg/dL — ABNORMAL HIGH (ref 70–99)
Potassium: 4.2 mmol/L (ref 3.5–5.1)
Sodium: 141 mmol/L (ref 135–145)
Total Bilirubin: 0.5 mg/dL (ref 0.3–1.2)
Total Protein: 7 g/dL (ref 6.5–8.1)

## 2022-07-17 LAB — URINALYSIS, COMPLETE (UACMP) WITH MICROSCOPIC
Bacteria, UA: NONE SEEN
Bilirubin Urine: NEGATIVE
Glucose, UA: NEGATIVE mg/dL
Hgb urine dipstick: NEGATIVE
Ketones, ur: NEGATIVE mg/dL
Leukocytes,Ua: NEGATIVE
Nitrite: NEGATIVE
Protein, ur: NEGATIVE mg/dL
RBC / HPF: NONE SEEN RBC/hpf (ref 0–5)
Specific Gravity, Urine: 1.02 (ref 1.005–1.030)
Squamous Epithelial / HPF: NONE SEEN /HPF (ref 0–5)
WBC, UA: NONE SEEN WBC/hpf (ref 0–5)
pH: 6.5 (ref 5.0–8.0)

## 2022-07-17 LAB — IRON AND IRON BINDING CAPACITY (CC-WL,HP ONLY)
Iron: 174 ug/dL — ABNORMAL HIGH (ref 28–170)
Saturation Ratios: 40 % — ABNORMAL HIGH (ref 10.4–31.8)
TIBC: 435 ug/dL (ref 250–450)
UIBC: 261 ug/dL (ref 148–442)

## 2022-07-17 LAB — LACTATE DEHYDROGENASE: LDH: 172 U/L (ref 98–192)

## 2022-07-17 LAB — FERRITIN: Ferritin: 13 ng/mL (ref 11–307)

## 2022-07-17 LAB — RETICULOCYTES
Immature Retic Fract: 13.7 % (ref 2.3–15.9)
RBC.: 4.6 MIL/uL (ref 3.87–5.11)
Retic Count, Absolute: 72.7 10*3/uL (ref 19.0–186.0)
Retic Ct Pct: 1.6 % (ref 0.4–3.1)

## 2022-07-17 NOTE — Progress Notes (Signed)
Hematology/Oncology Consultation   Name: Natalie Bradshaw      MRN: [id number]    Location: Room/bed info not found  Date: 07/17/2022 Time:11:32 AM   REFERRING PHYSICIAN: Gaynelle Adu, MD  REASON FOR CONSULT: Iron deficiency anemia    DIAGNOSIS: Iron deficiency anemia   HISTORY OF PRESENT ILLNESS:  Natalie Bradshaw is a very pleasant 77 yo [place] female with iron deficiency anemia.  She had an EGD and colonoscopy on 06/16/2022. On EGD she was noted to have hiatal hernia and Cameron's lesions. She has not noted any blood loss since her work up that day.  She is currently on oral iron daily.   No abnormal bruising. No petechiae.  She is symptomatic with fatigue, weakness, SOB with exertion, palpitations at night, facial flushing and lightheadedness while walking her dog.  No history of thyroid disease. She is borderline diabetic but not on treatment.  Her son had umbilical hernia repair and about 5 weeks after passed suddenly from a massive PE.  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 Her grandmother had esophageal cancer.  No fever, chills, n/v, cough, rash, chest pain, abdominal pain or changes in bowel habits.  She has long term issues with pressure in her pelvis and lower back pain with frequent urination. We will get a UA and culture today to assess for UTI. She will also follow-up with her urologist.  She has intermittent tingling and puffiness in her feet at night. This resolves by morning.  No falls or syncope reported.  Appetite and hydration are good. Weight is stable at 189 lbs.  She quit smoking in 1985. No recreational drug use.  She drinks 2 glasses of wine a night.  She was an Research officer, political party prior to retirement.   ROS: All other 10 point review of systems is negative.   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 180 g 3   atorvastatin (LIPITOR) 20 MG tablet Take 20 mg by mouth daily.     Fluorouracil (TOLAK) 4 % CREA Apply to affected area qhs Monday- Sunday x 2 weeks 40 g 2   HYDROcodone-acetaminophen (NORCO/VICODIN) 5-325 MG tablet Take 1 tablet by mouth every 6 (six) hours as needed. 12 tablet 0   traZODone (DESYREL) 50 MG tablet TAKE 1 TABLET BY MOUTH EVERY DAY AT BEDTIME AS NEEDED     tretinoin (RETIN-A) 0.1 % cream APPLY TO AFFECTED AREA EVERY DAY AT BEDTIME 45 g 11   No current facility-administered medications on file prior to visit.     PAST SURGICAL HISTORY Past Surgical History:  Procedure Laterality Date   SKIN BIOPSY      FAMILY HISTORY: No family history on file.  SOCIAL HISTORY:  reports that she has never smoked. She has never used smokeless tobacco. She reports that she does not drink alcohol and does  not use drugs.  PERFORMANCE STATUS: The patient's performance status is 1 - Symptomatic but completely ambulatory  PHYSICAL EXAM: Most Recent Vital Signs: Blood pressure (!) 149/65, pulse 60, temperature 97.9 F (36.6 C), temperature source Oral, resp. rate 20, height  (1.727 m), weight 189 lb 1.9 oz (85.8 kg), SpO2 98 %. BP (!) 149/65 (BP Location: Left Arm, Patient Position: Sitting)   Pulse 60   Temp 97.9 F (36.6 C) (Oral)   Resp 20   Ht  (1.727 m)   Wt 189 lb 1.9 oz (85.8 kg)   SpO2 98%   BMI 28.76 kg/m   General Appearance:    Alert, cooperative, no distress,  appears stated age  Head:    Normocephalic, without obvious abnormality, atraumatic  Eyes:    PERRL, conjunctiva/corneas clear, EOM's intact, fundi    benign, both eyes        Throat:   Lips, mucosa, and tongue normal; teeth and gums normal  Neck:   Supple, symmetrical, trachea midline, no adenopathy;    thyroid:  no enlargement/tenderness/nodules; no carotid   bruit or JVD  Back:     Symmetric, no curvature, ROM normal, no CVA tenderness  Lungs:     Clear to auscultation bilaterally, respirations unlabored  Chest Wall:    No tenderness or deformity   Heart:    Regular rate and rhythm, S1 and S2 normal, no murmur, rub   or gallop     Abdomen:     Soft, non-tender, bowel sounds active all four quadrants,    no masses, no organomegaly        Extremities:   Extremities normal, atraumatic, no cyanosis or edema  Pulses:   2+ and symmetric all extremities  Skin:   Skin color, texture, turgor normal, no rashes or lesions  Lymph nodes:   Cervical, supraclavicular, and axillary nodes normal  Neurologic:   CNII-XII intact, normal strength, sensation and reflexes    throughout    LABORATORY DATA:  Results for orders placed or performed in visit on 07/17/22 (from the past 48 hour(s))  CBC with Differential (Cancer Center Only)     Status: Abnormal   Collection Time: 07/17/22 10:34 AM  Result Value Ref Range   WBC Count 5.3 4.0 - 10.5 K/uL   RBC 4.61 3.87 - 5.11 MIL/uL   Hemoglobin 12.4 12.0 - 15.0 g/dL   HCT 96.0 45.4 - 09.8 %   MCV 85.7 80.0 - 100.0 fL   MCH 26.9 26.0 - 34.0 pg   MCHC 31.4 30.0 - 36.0 g/dL   RDW 11.9 (H) 14.7 - 82.9 %   Platelet Count 287 150 - 400 K/uL   nRBC 0.0 0.0 - 0.2 %   Neutrophils Relative % 63 %   Neutro Abs 3.3 1.7 - 7.7 K/uL   Lymphocytes Relative 27 %   Lymphs Abs 1.4 0.7 - 4.0 K/uL   Monocytes Relative 7 %   Monocytes Absolute 0.4 0.1 - 1.0 K/uL   Eosinophils Relative 2 %   Eosinophils Absolute 0.1 0.0 - 0.5 K/uL   Basophils Relative 0 %    Basophils Absolute 0.0 0.0 - 0.1 K/uL   Immature Granulocytes 1 %   Abs Immature Granulocytes 0.07 0.00 - 0.07 K/uL    Comment: Performed at [facility] at [facility], [address] [place], [place] [address]  CMP (Cancer Center only)     Status: Abnormal   Collection Time: 07/17/22 10:34  AM  Result Value Ref Range   Sodium 141 135 - 145 mmol/L   Potassium 4.2 3.5 - 5.1 mmol/L   Chloride 104 98 - 111 mmol/L   CO2 29 22 - 32 mmol/L   Glucose, Bld 116 (H) 70 - 99 mg/dL    Comment: Glucose reference range applies only to samples taken after fasting for at least 8 hours.   BUN 16 8 - 23 mg/dL   Creatinine 1.61 (H) 0.96 - 1.00 mg/dL   Calcium 9.6 8.9 - 04.5 mg/dL   Total Protein 7.0 6.5 - 8.1 g/dL   Albumin 4.2 3.5 - 5.0 g/dL   AST 20 15 - 41 U/L   ALT 16 0 - 44 U/L   Alkaline Phosphatase 43 38 - 126 U/L   Total Bilirubin 0.5 0.3 - 1.2 mg/dL   GFR, Estimated 53 (L) >60 mL/min    Comment: (NOTE) Calculated using the CKD-EPI Creatinine Equation (2021)    Anion gap 8 5 - 15    Comment: Performed at [facility] at [facility], [address], [place], [place] [address]  Lactate dehydrogenase (LDH)     Status: None   Collection Time: 07/17/22 10:34 AM  Result Value Ref Range   LDH 172 98 - 192 U/L    Comment: Performed at [facility] at [facility], [address], [place], [place] [address]  Reticulocytes     Status: None   Collection Time: 07/17/22 10:35 AM  Result Value Ref Range   Retic Ct Pct 1.6 0.4 - 3.1 %   RBC. 4.60 3.87 - 5.11 MIL/uL   Retic Count, Absolute 72.7 19.0 - 186.0 K/uL   Immature Retic Fract 13.7 2.3 - 15.9 %    Comment: Performed at [facility] at [facility], [address] [place], [place] [address]      RADIOGRAPHY: No results found.     PATHOLOGY: None  ASSESSMENT/PLAN: Ms. Freestone is a very pleasant 77 yo [place] female with iron deficiency  anemia.  We will get her set up for IV iron if needed. Iron studies are pending.  With her son passing suddenly from a massive PE post umbilical hernia repair we will get a hyper coag panel on her prior to her having hiatal hernia repair.  Follow-up in 6 weeks.   All questions were answered. The patient knows to call the clinic with any problems, questions or concerns. We can certainly see the patient much sooner if necessary.   Eileen Stanford, NP

## 2022-07-18 LAB — URINE CULTURE: Culture: NO GROWTH

## 2022-07-18 LAB — ERYTHROPOIETIN: Erythropoietin: 17.7 m[IU]/mL (ref 2.6–18.5)

## 2022-07-23 DIAGNOSIS — D509 Iron deficiency anemia, unspecified: Secondary | ICD-10-CM | POA: Diagnosis not present

## 2022-07-23 DIAGNOSIS — K449 Diaphragmatic hernia without obstruction or gangrene: Secondary | ICD-10-CM | POA: Diagnosis not present

## 2022-07-23 DIAGNOSIS — Z86010 Personal history of colonic polyps: Secondary | ICD-10-CM | POA: Diagnosis not present

## 2022-08-14 DIAGNOSIS — M461 Sacroiliitis, not elsewhere classified: Secondary | ICD-10-CM | POA: Diagnosis not present

## 2022-08-14 DIAGNOSIS — M7989 Other specified soft tissue disorders: Secondary | ICD-10-CM | POA: Diagnosis not present

## 2022-08-17 ENCOUNTER — Other Ambulatory Visit: Payer: Self-pay | Admitting: Pain Medicine

## 2022-08-17 DIAGNOSIS — M7989 Other specified soft tissue disorders: Secondary | ICD-10-CM

## 2022-08-17 DIAGNOSIS — M461 Sacroiliitis, not elsewhere classified: Secondary | ICD-10-CM

## 2022-08-18 ENCOUNTER — Ambulatory Visit
Admission: RE | Admit: 2022-08-18 | Discharge: 2022-08-18 | Disposition: A | Discharge status: Home / Self Care | Payer: Medicare HMO | Source: Ambulatory Visit | Attending: Pain Medicine | Admitting: Pain Medicine

## 2022-08-18 DIAGNOSIS — M461 Sacroiliitis, not elsewhere classified: Secondary | ICD-10-CM

## 2022-08-18 DIAGNOSIS — R2242 Localized swelling, mass and lump, left lower limb: Secondary | ICD-10-CM | POA: Diagnosis not present

## 2022-08-18 DIAGNOSIS — M7989 Other specified soft tissue disorders: Secondary | ICD-10-CM

## 2022-08-24 DIAGNOSIS — B029 Zoster without complications: Secondary | ICD-10-CM | POA: Diagnosis not present

## 2022-08-25 DIAGNOSIS — M1991 Primary osteoarthritis, unspecified site: Secondary | ICD-10-CM | POA: Diagnosis not present

## 2022-08-25 DIAGNOSIS — E663 Overweight: Secondary | ICD-10-CM | POA: Diagnosis not present

## 2022-08-25 DIAGNOSIS — Z6828 Body mass index (BMI) 28.0-28.9, adult: Secondary | ICD-10-CM | POA: Diagnosis not present

## 2022-08-25 DIAGNOSIS — D508 Other iron deficiency anemias: Secondary | ICD-10-CM | POA: Diagnosis not present

## 2022-08-25 DIAGNOSIS — B029 Zoster without complications: Secondary | ICD-10-CM | POA: Diagnosis not present

## 2022-08-25 DIAGNOSIS — M0609 Rheumatoid arthritis without rheumatoid factor, multiple sites: Secondary | ICD-10-CM | POA: Diagnosis not present

## 2022-08-25 DIAGNOSIS — Z79899 Other long term (current) drug therapy: Secondary | ICD-10-CM | POA: Diagnosis not present

## 2022-08-29 NOTE — Progress Notes (Deleted)
Cardiology Office Note:   Date:  08/29/2022  ID:  Natalie Bradshaw, DOB [birth year], MRN [id number]  History of Present Illness:   Natalie Bradshaw is a 77 y.o. female with history of HLD who was referred by Dr. Chanetta Marshall prior to hiatal hernia repair.  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  {Refresh Note OR Click here to enter BP  :1}***        Physical Exam:   VS:  There were no vitals taken for this visit.   Wt Readings from Last 3 Encounters:  07/17/22 189 lb 1.9 oz (85.8 kg)  04/15/21 178 lb (80.7 kg)     GEN: Well nourished, well developed in no acute distress NECK: No JVD; No carotid bruits CARDIAC: ***RRR, no murmurs, rubs, gallops RESPIRATORY:  Clear to auscultation without rales, wheezing or rhonchi  ABDOMEN: Soft, non-tender, non-distended EXTREMITIES:  No edema; No deformity   ASSESSMENT AND PLAN:   #Preop Evaluation:    {Are you ordering a CV Procedure (e.g. stress test, cath, DCCV, TEE, etc)?   Press F2        :[id number]}   Signed, Meriam Sprague, MD

## 2022-09-01 ENCOUNTER — Ambulatory Visit: Payer: Medicare HMO | Admitting: Cardiology

## 2022-09-02 ENCOUNTER — Emergency Department (HOSPITAL_BASED_OUTPATIENT_CLINIC_OR_DEPARTMENT_OTHER)
Admission: EM | Admit: 2022-09-02 | Discharge: 2022-09-02 | Disposition: A | Discharge status: Home / Self Care | Payer: Medicare HMO | Attending: Emergency Medicine | Admitting: Emergency Medicine

## 2022-09-02 ENCOUNTER — Encounter: Payer: Self-pay | Admitting: Family

## 2022-09-02 ENCOUNTER — Emergency Department (HOSPITAL_BASED_OUTPATIENT_CLINIC_OR_DEPARTMENT_OTHER): Payer: Medicare HMO

## 2022-09-02 ENCOUNTER — Other Ambulatory Visit: Payer: Self-pay

## 2022-09-02 ENCOUNTER — Encounter (HOSPITAL_BASED_OUTPATIENT_CLINIC_OR_DEPARTMENT_OTHER): Payer: Self-pay

## 2022-09-02 ENCOUNTER — Other Ambulatory Visit (HOSPITAL_BASED_OUTPATIENT_CLINIC_OR_DEPARTMENT_OTHER): Payer: Self-pay

## 2022-09-02 DIAGNOSIS — K5792 Diverticulitis of intestine, part unspecified, without perforation or abscess without bleeding: Secondary | ICD-10-CM

## 2022-09-02 DIAGNOSIS — Z87891 Personal history of nicotine dependence: Secondary | ICD-10-CM | POA: Diagnosis not present

## 2022-09-02 DIAGNOSIS — I4891 Unspecified atrial fibrillation: Secondary | ICD-10-CM | POA: Diagnosis not present

## 2022-09-02 DIAGNOSIS — N201 Calculus of ureter: Secondary | ICD-10-CM | POA: Diagnosis not present

## 2022-09-02 DIAGNOSIS — R197 Diarrhea, unspecified: Secondary | ICD-10-CM | POA: Diagnosis not present

## 2022-09-02 DIAGNOSIS — K5732 Diverticulitis of large intestine without perforation or abscess without bleeding: Secondary | ICD-10-CM | POA: Insufficient documentation

## 2022-09-02 DIAGNOSIS — Z85828 Personal history of other malignant neoplasm of skin: Secondary | ICD-10-CM | POA: Diagnosis not present

## 2022-09-02 DIAGNOSIS — I480 Paroxysmal atrial fibrillation: Secondary | ICD-10-CM | POA: Insufficient documentation

## 2022-09-02 DIAGNOSIS — R109 Unspecified abdominal pain: Secondary | ICD-10-CM | POA: Diagnosis not present

## 2022-09-02 DIAGNOSIS — I700 Atherosclerosis of aorta: Secondary | ICD-10-CM | POA: Diagnosis not present

## 2022-09-02 LAB — COMPREHENSIVE METABOLIC PANEL
ALT: 12 U/L (ref 0–44)
AST: 17 U/L (ref 15–41)
Albumin: 4.1 g/dL (ref 3.5–5.0)
Alkaline Phosphatase: 48 U/L (ref 38–126)
Anion gap: 11 (ref 5–15)
BUN: 11 mg/dL (ref 8–23)
CO2: 25 mmol/L (ref 22–32)
Calcium: 9.7 mg/dL (ref 8.9–10.3)
Chloride: 104 mmol/L (ref 98–111)
Creatinine, Ser: 1.14 mg/dL — ABNORMAL HIGH (ref 0.44–1.00)
GFR, Estimated: 49 mL/min — ABNORMAL LOW (ref >60)
Glucose, Bld: 120 mg/dL — ABNORMAL HIGH (ref 70–99)
Potassium: 3.5 mmol/L (ref 3.5–5.1)
Sodium: 140 mmol/L (ref 135–145)
Total Bilirubin: 0.8 mg/dL (ref 0.3–1.2)
Total Protein: 7.1 g/dL (ref 6.5–8.1)

## 2022-09-02 LAB — CBC
HCT: 41.6 % (ref 36.0–46.0)
Hemoglobin: 14 g/dL (ref 12.0–15.0)
MCH: 30.8 pg (ref 26.0–34.0)
MCHC: 33.7 g/dL (ref 30.0–36.0)
MCV: 91.4 fL (ref 80.0–100.0)
Platelets: 231 10*3/uL (ref 150–400)
RBC: 4.55 MIL/uL (ref 3.87–5.11)
RDW: 16.9 % — ABNORMAL HIGH (ref 11.5–15.5)
WBC: 7.6 10*3/uL (ref 4.0–10.5)
nRBC: 0 % (ref 0.0–0.2)

## 2022-09-02 LAB — URINALYSIS, ROUTINE W REFLEX MICROSCOPIC
Bilirubin Urine: NEGATIVE
Glucose, UA: NEGATIVE mg/dL
Ketones, ur: NEGATIVE mg/dL
Nitrite: NEGATIVE
Protein, ur: 30 mg/dL — AB
Specific Gravity, Urine: 1.011 (ref 1.005–1.030)
pH: 5.5 (ref 5.0–8.0)

## 2022-09-02 LAB — PROTIME-INR
INR: 0.9 (ref 0.8–1.2)
Prothrombin Time: 12.7 seconds (ref 11.4–15.2)

## 2022-09-02 LAB — MAGNESIUM: Magnesium: 2.1 mg/dL (ref 1.7–2.4)

## 2022-09-02 LAB — LIPASE, BLOOD: Lipase: 17 U/L (ref 11–51)

## 2022-09-02 MED ORDER — SODIUM CHLORIDE 0.9 % IV BOLUS
1000.0000 mL | Freq: Once | INTRAVENOUS | Status: AC
  Administered 2022-09-02: 1000 mL via INTRAVENOUS

## 2022-09-02 MED ORDER — ONDANSETRON HCL 4 MG PO TABS
4.0000 mg | ORAL_TABLET | Freq: Three times a day (TID) | ORAL | 0 refills | Status: DC | PRN
  Filled 2022-09-02: qty 12, 4d supply, fill #0

## 2022-09-02 MED ORDER — ONDANSETRON HCL 4 MG/2ML IJ SOLN
4.0000 mg | Freq: Once | INTRAMUSCULAR | Status: AC
  Administered 2022-09-02: 4 mg via INTRAVENOUS
  Filled 2022-09-02: qty 2

## 2022-09-02 MED ORDER — AMOXICILLIN-POT CLAVULANATE 875-125 MG PO TABS
1.0000 | ORAL_TABLET | Freq: Two times a day (BID) | ORAL | 0 refills | Status: DC
  Filled 2022-09-02: qty 14, 7d supply, fill #0

## 2022-09-02 MED ORDER — IOHEXOL 300 MG/ML  SOLN
100.0000 mL | Freq: Once | INTRAMUSCULAR | Status: AC | PRN
  Administered 2022-09-02: 85 mL via INTRAVENOUS

## 2022-09-02 NOTE — ED Notes (Signed)
Discharge instructions, follow up care with PCP and cardiology, and prescriptions reviewed and explained, pt verbalized understanding and had no further questions on d/c. Pt caox4, ambulatory, NAD on d/c.

## 2022-09-02 NOTE — Discharge Instructions (Addendum)
We evaluated you for your nausea, abdominal pain and diarrhea.  Your laboratory tests were reassuring, and only showed very slight dehydration.  Please be sure to drink a lot of fluids.  Your CT scan showed diverticulitis, an infection in your colon which is likely the cause of your symptoms.  We have prescribed you antibiotics and nausea medicine for your symptoms.  If you have pain, you can take at 1000 mg of Tylenol every 6 hours as needed.  We noticed that you had an episode of atrial fibrillation while you are on the monitor in the emergency department.  This went away on its own.  This is an abnormal heart rhythm that can increase your risk of stroke.  It is important to follow-up with a cardiologist as you may need to start an anticoagulant to help prevent risk of stroke.  Since you are having some rectal bleeding, I do not think it is safe to start this today.  Please keep an eye out for any stroke symptoms such as numbness or tingling, weakness, facial droop, vision changes, slurred speech or any other symptoms.  Please return to the emergency department if you develop any new or worsening symptoms such as lightheadedness or dizziness, increased blood in your stool, fainting, increasing weakness, persistent nausea or vomiting, high fevers, worsening pain, or any other symptoms.  Diverticulitis usually gets better with antibiotics, but sometimes you may need IV antibiotics or even a surgery if it does not improve.

## 2022-09-02 NOTE — ED Triage Notes (Signed)
Pt arrived POV, caox4. Pt c/o diarrhea with blood in the stool and N/V since last Thursday, approx 1 wk. Pt reports she just finished abx she was taking for shingles yesterday. Pt also c/o decreased appetite and weakness. Has taken Imodium with minimal relief. Pt states she had endoscopy and colonoscopy 1 month ago showing large hemorrhoid. Hx anemia.

## 2022-09-02 NOTE — ED Provider Notes (Signed)
[facility] EMERGENCY DEPARTMENT AT [facility] Provider Note  CSN: [id number] Arrival date & time: 09/02/22 4098  Chief Complaint(s) Diarrhea  HPI Natalie Bradshaw is a 77 y.o. female without significant past medical history presenting to the emergency department with diarrhea.  Patient reports diarrhea for almost 1 week.  She reports that she has blood in her stool but she does have hemorrhoids.  She also reports nausea and vomiting, worse every time she eats.  No fevers but has chills.  No urinary symptoms.  No recent antibiotic use but did have shingles on her abdomen and was prescribed valacyclovir.  No recent travel.  Has been taking Imodium which does help with the diarrhea.   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 Authorizing Provider  amoxicillin-clavulanate (AUGMENTIN) 875-125 MG tablet Take 1 tablet by mouth every 12 (twelve) hours. 09/02/22  Yes Lonell Grandchild, MD  augmented betamethasone dipropionate (DIPROLENE-AF) 0.05 % cream Apply 1 Application topically 2 (two) times daily. 08/24/22  Yes [provider]  ondansetron (ZOFRAN) 4 MG tablet Take 1 tablet (4 mg total) by mouth every 8 (eight) hours as needed for nausea or vomiting. 09/02/22  Yes Lonell Grandchild, MD  pantoprazole (PROTONIX) 40 MG tablet Take 40 mg by mouth  daily. 06/10/22  Yes [provider]  valACYclovir (VALTREX) 1000 MG tablet Take 1,000 mg by mouth every 8 (eight) hours. 08/24/22  Yes [provider]  alclomethasone (ACLOVATE) 0.05 % cream Apply topically 2 (two) times daily as needed (Rash). 08/06/20   Sheffield, Harvin Hazel R, PA-C  atorvastatin (LIPITOR) 20 MG tablet Take 20 mg by mouth daily. 06/18/19   [provider]  Fluorouracil (TOLAK) 4 % CREA Apply to affected area qhs Monday- Sunday x 2 weeks 03/13/21   Glyn Ade, PA-C  HYDROcodone-acetaminophen (NORCO/VICODIN) 5-325 MG tablet Take 1 tablet by mouth every 6 (six) hours as needed. 04/15/21   Linwood Dibbles, MD  traZODone (DESYREL) 50 MG tablet TAKE 1 TABLET BY MOUTH EVERY DAY AT BEDTIME AS NEEDED 05/28/19   [provider]  tretinoin (RETIN-A) 0.1 % cream APPLY TO AFFECTED AREA EVERY DAY AT BEDTIME 06/12/21   Glyn Ade, PA-C  Past Surgical History Past Surgical History:  Procedure Laterality Date   SKIN BIOPSY     Family History No family history on file.  Social History Social History   Tobacco Use   Smoking status: Former    Types: Cigarettes    Quit date: 08/28/1983    Years since quitting: 39.0   Smokeless tobacco: Never  Vaping Use   Vaping Use: Never used  Substance Use Topics   Alcohol use: Yes   Drug use: Never   Allergies Leflunomide  Review of Systems Review of Systems  All other systems reviewed and are negative.   Physical Exam Vital Signs  I have reviewed the triage vital signs BP (!) 146/79   Pulse 65   Temp 97.7 F (36.5 C) (Oral)   Resp 15   Ht 5\' 8"  (1.727 m)   Wt 79.8 kg   SpO2 98%   BMI 26.76 kg/m  Physical Exam Vitals and nursing note reviewed.  Constitutional:      General: She is not in acute distress.    Appearance: She is well-developed.  HENT:     Head:  Normocephalic and atraumatic.     Mouth/Throat:     Mouth: Mucous membranes are moist.  Eyes:     Pupils: Pupils are equal, round, and reactive to light.  Cardiovascular:     Rate and Rhythm: Normal rate and regular rhythm.     Heart sounds: No murmur heard. Pulmonary:     Effort: Pulmonary effort is normal. No respiratory distress.     Breath sounds: Normal breath sounds.  Abdominal:     General: Abdomen is flat.     Palpations: Abdomen is soft.     Tenderness: There is abdominal tenderness (LLQ including areas without shingles rash).     Comments: Healing shingles type dermatomal rash over left lower abdomen to left flank  Musculoskeletal:        General: No tenderness.     Right lower leg: No edema.     Left lower leg: No edema.  Skin:    General: Skin is warm and dry.  Neurological:     General: No focal deficit present.     Mental Status: She is alert. Mental status is at baseline.  Psychiatric:        Mood and Affect: Mood normal.        Behavior: Behavior normal.     ED Results and Treatments Labs (all labs ordered are listed, but only abnormal results are displayed) Labs Reviewed  COMPREHENSIVE METABOLIC PANEL - Abnormal; Notable for the following components:      Result Value   Glucose, Bld 120 (*)    Creatinine, Ser 1.14 (*)    GFR, Estimated 49 (*)    All other components within normal limits  CBC - Abnormal; Notable for the following components:   RDW 16.9 (*)    All other components within normal limits  URINALYSIS, ROUTINE W REFLEX MICROSCOPIC - Abnormal; Notable for the following components:   Hgb urine dipstick TRACE (*)    Protein, ur 30 (*)    Leukocytes,Ua TRACE (*)    Bacteria, UA RARE (*)    All other components within normal limits  C DIFFICILE QUICK SCREEN W PCR REFLEX    GASTROINTESTINAL PANEL BY PCR, STOOL (REPLACES STOOL CULTURE)  LIPASE, BLOOD  PROTIME-INR  MAGNESIUM  Radiology CT ABDOMEN PELVIS W CONTRAST  Result Date: 09/02/2022 CLINICAL DATA:  LLQ abdominal pain EXAM: CT ABDOMEN AND PELVIS WITH CONTRAST TECHNIQUE: Multidetector CT imaging of the abdomen and pelvis was performed using the standard protocol following bolus administration of intravenous contrast. RADIATION DOSE REDUCTION: This exam was performed according to the departmental dose-optimization program which includes automated exposure control, adjustment of the mA and/or kV according to patient size and/or use of iterative reconstruction technique. CONTRAST:  85mL OMNIPAQUE IOHEXOL 300 MG/ML  SOLN COMPARISON:  None Available. FINDINGS: Lower chest: No acute abnormality. Hepatobiliary: No focal liver abnormality is seen. No gallstones, gallbladder wall thickening, or biliary dilatation. Pancreas: Unremarkable. No pancreatic ductal dilatation or surrounding inflammatory changes. Spleen: Normal in size without focal abnormality. Adrenals/Urinary Tract: Adrenal glands are unremarkable. Kidneys are normal, without renal calculi, focal lesion, or hydronephrosis. Bladder is unremarkable. Stomach/Bowel: Sigmoid colon diverticula with mild associated bowel wall thickening and mild surrounding edema/stranding, suggestive of acute diverticulitis. No drainable fluid collection. No evidence of bowel obstruction. Vascular/Lymphatic: Aortic atherosclerosis. No enlarged abdominal or pelvic lymph nodes. Reproductive: Uterus and bilateral adnexa are unremarkable. Other: No abdominal wall hernia or abnormality. No abdominopelvic ascites. Musculoskeletal: Degenerative changes of the lumbar spine. IMPRESSION: Findings suggestive of acute sigmoid colon diverticulitis. No drainable fluid collection. Electronically Signed   By: Feliberto Harts M.D.   On: 09/02/2022 11:48    Pertinent labs & imaging results that were available during my care of the patient were reviewed by me and  considered in my medical decision making (see MDM for details).  Medications Ordered in ED Medications  sodium chloride 0.9 % bolus 1,000 mL (1,000 mLs Intravenous New Bag/Given 09/02/22 1055)  ondansetron (ZOFRAN) injection 4 mg (4 mg Intravenous Given 09/02/22 1055)  iohexol (OMNIPAQUE) 300 MG/ML solution 100 mL (85 mLs Intravenous Contrast Given 09/02/22 1124)                                                                                                                                     Procedures Procedures  (including critical care time)  Medical Decision Making / ED Course   MDM:  77 year old female presenting to the emergency department with diarrhea.  Patient overall well-appearing, physical exam with well-healing shingles rash to the left lower abdomen as well as tenderness in the left lower quadrant even outside of areas of rash.  Differential includes bacterial diarrhea such as C. difficile or invasive diarrhea, colitis. less likely diverticulitis or perforation but given tenderness will obtain CT scan.  Given length of symptoms will obtain laboratory testing to evaluate for dehydration, electrolyte abnormalities.  Patient did report small amount of blood in her stool, she is not on blood thinners and her hemoglobin is reassuring in the emergency department, could be due to diarrhea or colitis, possibly hemorrhoids.  Will give fluids, Zofran and reassess.  Clinical Course as of 09/02/22 1225  Wed Sep 02, 2022  1123 Patient briefly had episode of atrial fibrillation and self converted. Chads2vasc is 3. Given patient having rectal bleeding currently I don't' think it would be safe to start anticoagulation at this time but will refer to cardiology if patient ultimately discharged. [WS]  1211 Patient feels better after Zofran.  CT scan shows acute uncomplicated diverticulitis.  Will prescribe Augmentin.  Will p.o. trial.  If patient able to tolerate p.o., anticipate discharge with  outpatient follow-up.  Discussed atrial fibrillation and anticoagulation with the patient.  She reports that she was actually supposed to see a cardiologist for pre operative clearance yesterday but had to cancel.  She rescheduled that not till the end of the month.  Will place more urgent referral.  Discussed return precautions for any strokelike symptoms, discussed risks and benefits of anticoagulation and with some rectal bleeding patient agrees with deferring at this time. Will discharge patient to home. All questions answered. Patient comfortable with plan of discharge. Return precautions discussed with patient and specified on the after visit summary.  [WS]    Clinical Course User Index [WS] Lonell Grandchild, MD     Additional history obtained: -External records from outside source obtained and reviewed including: Chart review including previous notes, labs, imaging, consultation notes including oncology note 4/12 for iron deficiency anemia   Lab Tests: -I ordered, reviewed, and interpreted labs.   The pertinent results include:   Labs Reviewed  COMPREHENSIVE METABOLIC PANEL - Abnormal; Notable for the following components:      Result Value   Glucose, Bld 120 (*)    Creatinine, Ser 1.14 (*)    GFR, Estimated 49 (*)    All other components within normal limits  CBC - Abnormal; Notable for the following components:   RDW 16.9 (*)    All other components within normal limits  URINALYSIS, ROUTINE W REFLEX MICROSCOPIC - Abnormal; Notable for the following components:   Hgb urine dipstick TRACE (*)    Protein, ur 30 (*)    Leukocytes,Ua TRACE (*)    Bacteria, UA RARE (*)    All other components within normal limits  C DIFFICILE QUICK SCREEN W PCR REFLEX    GASTROINTESTINAL PANEL BY PCR, STOOL (REPLACES STOOL CULTURE)  LIPASE, BLOOD  PROTIME-INR  MAGNESIUM    Notable for very mild AKI  EKG   EKG Interpretation  Date/Time:  Wednesday Sep 02 2022 11:21:14 EDT Ventricular  Rate:  61 PR Interval:  185 QRS Duration: 104 QT Interval:  447 QTC Calculation: 451 R Axis:   9 Text Interpretation: Sinus rhythm Low voltage, precordial leads Probable anteroseptal infarct, old Confirmed by Alvino Blood ([id number]) on 09/02/2022 11:23:43 AM         Imaging Studies ordered: I ordered imaging studies including CT A/P On my interpretation imaging demonstrates diverticulitis I independently visualized and interpreted imaging. I agree with the radiologist interpretation   Medicines ordered and prescription drug management: Meds ordered this encounter  Medications   sodium chloride 0.9 % bolus 1,000 mL   ondansetron (ZOFRAN) injection 4 mg   iohexol (OMNIPAQUE) 300 MG/ML solution 100 mL   amoxicillin-clavulanate (AUGMENTIN) 875-125 MG tablet    Sig: Take 1 tablet by mouth every 12 (twelve) hours.    Dispense:  14 tablet    Refill:  0   ondansetron (ZOFRAN) 4 MG tablet    Sig: Take 1 tablet (4 mg total) by mouth every 8 (eight) hours as needed for nausea or vomiting.    Dispense:  12  tablet    Refill:  0    -I have reviewed the patients home medicines and have made adjustments as needed   Cardiac Monitoring: The patient was maintained on a cardiac monitor.  I personally viewed and interpreted the cardiac monitored which showed an underlying rhythm of: atrial fibrillation which resolved   Social Determinants of Health:  Diagnosis or treatment significantly limited by social determinants of health: former smoker   Reevaluation: After the interventions noted above, I reevaluated the patient and found that their symptoms have improved  Co morbidities that complicate the patient evaluation  Past Medical History:  Diagnosis Date   Atypical mole 10/31/2013   moderate- midline,chest-margins clear   Atypical mole 09/11/2021   Right Thigh Anterior (severe)   BMI 20.0-20.9, adult    Colon polyp    Dizziness    Fatigue    Hemorrhoids    Hiatal hernia     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    Final Clinical Impression(s) / ED Diagnoses Final diagnoses:  Diverticulitis  Paroxysmal atrial fibrillation (HCC)     This chart was dictated using voice recognition software.  Despite best efforts to proofread,  errors can occur which can change the documentation meaning.    Lonell Grandchild, MD 09/02/22 1225

## 2022-09-24 DIAGNOSIS — R059 Cough, unspecified: Secondary | ICD-10-CM | POA: Diagnosis not present

## 2022-09-24 DIAGNOSIS — E1165 Type 2 diabetes mellitus with hyperglycemia: Secondary | ICD-10-CM | POA: Diagnosis not present

## 2022-09-24 DIAGNOSIS — Z03818 Encounter for observation for suspected exposure to other biological agents ruled out: Secondary | ICD-10-CM | POA: Diagnosis not present

## 2022-09-24 DIAGNOSIS — N3090 Cystitis, unspecified without hematuria: Secondary | ICD-10-CM | POA: Diagnosis not present

## 2022-09-28 NOTE — Progress Notes (Unsigned)
Cardiology Office Note:    Date:  09/29/2022   ID:  Natalie, Bradshaw 06-09-1945, MRN [id number]  PCP:  Natalie Hale, MD   [facility] Providers Cardiologist:  None     Referring MD: Natalie Bradshaw, *   No chief complaint on file. Pre-Op  History of Present Illness:    Natalie Bradshaw is a 77 y.o. female with no cardiac dx hx, referral for pre-op for hernia repair. She was noted to have c/f a brief episode of afib in late May in the ED. Chads2vasc=3, however she p/w diarrhea and told them that she has blood in her stools. She also noted a hx of hemorrhoids. Notes no prior hx of afib. He brother passed of a PE after surgery. She was sent for pre-op to evaluate her cardiac risk. She was in [place] for vacation recently and had no issues with BP or SOB. She walked 700 stone steps.  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 180 g 3   atorvastatin (LIPITOR) 20 MG tablet Take 20 mg by mouth daily.     augmented betamethasone dipropionate (DIPROLENE-AF) 0.05 % cream Apply 1 Application topically 2 (two) times daily.     Fluorouracil (TOLAK) 4 % CREA Apply to affected area qhs Monday- Sunday x 2 weeks 40 g 2   HYDROcodone-acetaminophen (NORCO/VICODIN) 5-325 MG tablet Take 1 tablet by mouth  every 6 (six) hours as needed. 12 tablet 0   pantoprazole (PROTONIX) 40 MG tablet Take 40 mg by mouth daily.     traZODone (DESYREL) 50 MG tablet TAKE 1 TABLET BY MOUTH EVERY DAY AT BEDTIME AS NEEDED     tretinoin (RETIN-A) 0.1 % cream APPLY TO AFFECTED AREA EVERY DAY AT BEDTIME 45 g 11   amoxicillin-clavulanate (AUGMENTIN) 875-125 MG tablet Take 1 tablet by mouth every 12 (twelve) hours. (Patient not taking: Reported on 09/29/2022) 14 tablet 0   baricitinib (OLUMIANT) tablet Take 2 mg by mouth daily.     ondansetron (ZOFRAN) 4 MG tablet Take 1 tablet (4 mg total) by mouth every 8 (eight) hours as needed for nausea or vomiting. (Patient not taking: Reported on 09/29/2022) 12 tablet 0   valACYclovir (VALTREX) 1000 MG tablet Take 1,000 mg by mouth every 8 (eight) hours. (Patient not taking: Reported on 09/29/2022)     No current facility-administered medications on file prior to visit.    Allergies:   Leflunomide   Social History   Socioeconomic History   Marital status: Divorced    Spouse name: Not on file   Number of children: Not on file   Years of education: Not on file   Highest education level: Not on file  Occupational History   Not on file  Tobacco Use   Smoking status: Former    Types: Cigarettes    Quit date: 08/28/1983    Years since quitting: 39.1   Smokeless tobacco: Never  Vaping Use   Vaping Use: Never used  Substance and Sexual Activity   Alcohol use: Yes   Drug use: Never   Sexual activity: Not on file  Other Topics Concern   Not on file  Social History Narrative   Not on file   Social Determinants of Health   Financial Resource Strain: Not on file  Food Insecurity: Not on file  Transportation Needs: Not on file  Physical Activity: Not on file  Stress: Not on file  Social Connections: Not on file     Family History: NA  ROS:   Please see the history of present illness.     All other systems reviewed and are negative.  EKGs/Labs/Other Studies  Reviewed:    The following studies were reviewed today: EKG Interpretation  Date/Time:  Tuesday September 29 2022 09:25:14 EDT Ventricular Rate:  63 PR Interval:  174 QRS Duration: 80 QT Interval:  424 QTC Calculation: 433 R Axis:   0 Text Interpretation: Normal sinus rhythm Normal ECG When compared with ECG of 02-Sep-2022 11:21, PREVIOUS ECG IS PRESENT Confirmed by Natalie Bradshaw ([id number]) on 09/29/2022 9:39:32 AM  EKG Interpretation  Date/Time:  Tuesday September 29 2022 09:25:14 EDT Ventricular Rate:  63 PR Interval:  174 QRS Duration: 80 QT Interval:  424 QTC Calculation: 433 R Axis:   0 Text Interpretation: Normal sinus rhythm Normal ECG When compared with ECG of 02-Sep-2022 11:21, PREVIOUS ECG IS PRESENT Confirmed by Natalie Bradshaw ([id number]) on 09/29/2022 9:39:32 AM    Recent Labs: 09/02/2022: ALT 12; BUN 11; Creatinine, Ser 1.14; Hemoglobin 14.0; Magnesium 2.1; Platelets 231; Potassium 3.5; Sodium 140  Recent Lipid Panel No results found for: "CHOL", "TRIG", "HDL", "CHOLHDL", "VLDL", "LDLCALC", "LDLDIRECT"   Risk Assessment/Calculations:         Physical Exam:    VS:   Vitals:   09/29/22 0934  BP: 120/84  Pulse: 63  SpO2: 97%     Wt Readings from Last 3 Encounters:  09/29/22 182 lb 6.4 oz (82.7 kg)  09/02/22 176 lb (79.8 kg)  07/17/22 189 lb 1.9 oz (85.8 kg)     GEN:  Well nourished, well developed in no acute distress HEENT: Normal NECK: No JVD; No carotid bruits CARDIAC: RRR, no murmurs, rubs, gallops RESPIRATORY:  Clear to auscultation without rales, wheezing or rhonchi  ABDOMEN: Soft, non-tender, non-distended MUSCULOSKELETAL:  No edema; No deformity  SKIN: Warm and dry NEUROLOGIC:  Alert and oriented x 3 PSYCHIATRIC:  Normal affect   ASSESSMENT:   ?PAF Most of her  EKGs were sinus with PACs in the ED. However, EKG 11:09 AM looks more like afib.   Will get a ziopatch to confirm afib and discuss AC with her thereafter. Otherwise, she denies bleeding.  Pre-Op -  Natalie Bradshaw presents for pre-op for hiatal hernia. She can walk > 4 METS without shortness of breath or chest pressure. She is low risk for low risk surgery. She is acceptable risk for hiatal hernia surgery.  PLAN:    In order of problems listed above:  2 week ziopatch She is low risk for low risk surgery. She is acceptable cardiac risk for hiatal hernia surgery. Follow up pending results     Medication Adjustments/Labs and Tests Ordered: Current medicines are reviewed at length with the patient today.  Concerns  regarding medicines are outlined above.  Orders Placed This Encounter  Procedures   EKG 12-Lead   No orders of the defined types were placed in this encounter.   Patient Instructions  Medication Instructions:  Your physician recommends that you continue on your current medications as directed. Please refer to the Current Medication list given to you today.  *If you need a refill on your cardiac medications before your next appointment, please call your pharmacy*   Testing/Procedures:  ZIO XT- Long Term Monitor Instructions  Your physician has requested you wear a ZIO patch monitor for 14 days.  This is a single patch monitor. [employer] supplies one patch monitor per enrollment. Additional stickers are not available. Please do not apply patch if you will be having a Nuclear Stress Test,  Echocardiogram, Cardiac CT, MRI, or Chest Xray during the period you would be wearing the  monitor. The patch cannot be worn during these tests. You cannot remove and re-apply the  ZIO XT patch monitor.  Your ZIO patch monitor will be mailed 3 day USPS to your address on file. It may take 3-5 days  to receive your monitor after you have been enrolled.  Once you have received your monitor, please review the enclosed instructions. Your monitor  has already been registered assigning a specific monitor serial # to you.  Billing and Patient Assistance Program Information  We have supplied  [employer] with any of your insurance information on file for billing purposes. [employer] offers a sliding scale Patient Assistance Program for patients that do not have  insurance, or whose insurance does not completely cover the cost of the ZIO monitor.  You must apply for the Patient Assistance Program to qualify for this discounted rate.  To apply, please call [employer] at [phone], select option 4, select option 2, ask to apply for  Patient Assistance Program. Meredeth Ide will ask your household income, and how many people  are in your household. They will quote your out-of-pocket cost based on that information.  [employer] will also be able to set up a 38-month, interest-free payment plan if needed.  Applying the monitor   Shave hair from upper left chest.  Hold abrader disc by orange tab. Rub abrader in 40 strokes over the upper left chest as  indicated in your monitor instructions.  Clean area with 4 enclosed alcohol pads. Let dry.  Apply patch as indicated in monitor instructions. Patch will be placed under collarbone on left  side of chest with arrow pointing upward.  Rub patch adhesive wings for 2 minutes. Remove white label marked "1". Remove the white  label marked "2". Rub patch adhesive wings for 2 additional minutes.  While looking in a mirror, press and release button in center of patch. A small green light will  flash 3-4 times. This will be your only indicator that the monitor has been turned on.  Do not shower for the first 24 hours. You may shower after the first 24 hours.  Press the button if you feel a symptom. You will hear a small click. Record Date, Time and  Symptom in the Patient Logbook.  When you are ready to remove the patch, follow instructions on the last 2 pages of Patient  Logbook. Stick patch monitor onto the last page of Patient Logbook.  Place Patient Logbook in the blue and white box. Use locking tab on box and tape box closed  securely. The blue and white box  has prepaid postage on it. Please  place it in the mailbox as  soon as possible. Your physician should have your test results approximately 7 days after the  monitor has been mailed back to [facility].  Call [facility] Customer Care at [phone] if you have questions regarding  your ZIO XT patch monitor. Call them immediately if you see an orange light blinking on your  monitor.  If your monitor falls off in less than 4 days, contact our Monitor department at [phone].  If your monitor becomes loose or falls off after 4 days call [employer] at [phone] for  suggestions on securing your monitor    Follow-Up: At [facility], you and your health needs are our priority.  As part of our continuing mission to provide you with exceptional heart care, we have created designated Provider Care Teams.  These Care Teams include your primary Cardiologist (physician) and Advanced Practice Providers (APPs -  Physician Assistants and Nurse Practitioners) who all work together to provide you with the care you need, when you need it.  We recommend signing up for the patient portal called "MyChart".  Sign up information is provided on this After Visit Summary.  MyChart is used to connect with patients for Virtual Visits (Telemedicine).  Patients are able to view lab/test results, encounter notes, upcoming appointments, etc.  Non-urgent messages can be sent to your provider as well.   To learn more about what you can do with MyChart, go to [email or web address].    Your next appointment:   We will see you on an as needed basis.  Provider:   Dr. Carolan Bradshaw    Signed, Maisie Fus, MD  09/29/2022 2:37 PM    [facility]

## 2022-09-29 ENCOUNTER — Other Ambulatory Visit: Payer: Self-pay | Admitting: Internal Medicine

## 2022-09-29 ENCOUNTER — Ambulatory Visit: Payer: Medicare HMO | Attending: Cardiology | Admitting: Internal Medicine

## 2022-09-29 ENCOUNTER — Ambulatory Visit (INDEPENDENT_AMBULATORY_CARE_PROVIDER_SITE_OTHER): Payer: Medicare HMO

## 2022-09-29 VITALS — BP 120/84 | HR 63 | Ht 68.0 in | Wt 182.4 lb

## 2022-09-29 DIAGNOSIS — Z136 Encounter for screening for cardiovascular disorders: Secondary | ICD-10-CM

## 2022-09-29 DIAGNOSIS — Z8679 Personal history of other diseases of the circulatory system: Secondary | ICD-10-CM

## 2022-09-29 DIAGNOSIS — R9431 Abnormal electrocardiogram [ECG] [EKG]: Secondary | ICD-10-CM

## 2022-09-29 DIAGNOSIS — I480 Paroxysmal atrial fibrillation: Secondary | ICD-10-CM

## 2022-09-29 NOTE — Progress Notes (Unsigned)
Enrolled for [employer] to mail a ZIO XT long term holter monitor to the patients address on file.  

## 2022-09-29 NOTE — Patient Instructions (Signed)
Medication Instructions:  Your physician recommends that you continue on your current medications as directed. Please refer to the Current Medication list given to you today.  *If you need a refill on your cardiac medications before your next appointment, please call your pharmacy*   Testing/Procedures:  Padroni Monitor Instructions  Your physician has requested you wear a ZIO patch monitor for 14 days.  This is a single patch monitor. [employer] supplies one patch monitor per enrollment. Additional stickers are not available. Please do not apply patch if you will be having a Nuclear Stress Test,  Echocardiogram, Cardiac CT, MRI, or Chest Xray during the period you would be wearing the  monitor. The patch cannot be worn during these tests. You cannot remove and re-apply the  ZIO XT patch monitor.  Your ZIO patch monitor will be mailed 3 day USPS to your address on file. It may take 3-5 days  to receive your monitor after you have been enrolled.  Once you have received your monitor, please review the enclosed instructions. Your monitor  has already been registered assigning a specific monitor serial # to you.  Billing and Patient Assistance Program Information  We have supplied [employer] with any of your insurance information on file for billing purposes. [employer] offers a sliding scale Patient Assistance Program for patients that do not have  insurance, or whose insurance does not completely cover the cost of the ZIO monitor.  You must apply for the Patient Assistance Program to qualify for this discounted rate.  To apply, please call [employer] at [phone], select option 4, select option 2, ask to apply for  Patient Assistance Program. Theodore Demark will ask your household income, and how many people  are in your household. They will quote your out-of-pocket cost based on that information.  [employer] will also be able to set up a 74-month interest-free payment plan if needed.  Applying  the monitor   Shave hair from upper left chest.  Hold abrader disc by orange tab. Rub abrader in 40 strokes over the upper left chest as  indicated in your monitor instructions.  Clean area with 4 enclosed alcohol pads. Let dry.  Apply patch as indicated in monitor instructions. Patch will be placed under collarbone on left  side of chest with arrow pointing upward.  Rub patch adhesive wings for 2 minutes. Remove white label marked "1". Remove the white  label marked "2". Rub patch adhesive wings for 2 additional minutes.  While looking in a mirror, press and release button in center of patch. A small green light will  flash 3-4 times. This will be your only indicator that the monitor has been turned on.  Do not shower for the first 24 hours. You may shower after the first 24 hours.  Press the button if you feel a symptom. You will hear a small click. Record Date, Time and  Symptom in the Patient Logbook.  When you are ready to remove the patch, follow instructions on the last 2 pages of Patient  Logbook. Stick patch monitor onto the last page of Patient Logbook.  Place Patient Logbook in the blue and white box. Use locking tab on box and tape box closed  securely. The blue and white box has prepaid postage on it. Please place it in the mailbox as  soon as possible. Your physician should have your test results approximately 7 days after the  [facility]  Call [employer]  Care at [phone] if you have questions regarding  your ZIO XT patch monitor. Call them immediately if you see an orange light blinking on your  monitor.  If your monitor falls off in less than 4 days, contact our Monitor department at [phone].  If your monitor becomes loose or falls off after 4 days call [employer] at [phone] for  suggestions on securing your monitor    Follow-Up: At [facility], you and your health needs are our priority.  As part  of our continuing mission to provide you with exceptional heart care, we have created designated Provider Care Teams.  These Care Teams include your primary Cardiologist (physician) and Advanced Practice Providers (APPs -  Physician Assistants and Nurse Practitioners) who all work together to provide you with the care you need, when you need it.  We recommend signing up for the patient portal called "MyChart".  Sign up information is provided on this After Visit Summary.  MyChart is used to connect with patients for Virtual Visits (Telemedicine).  Patients are able to view lab/test results, encounter notes, upcoming appointments, etc.  Non-urgent messages can be sent to your provider as well.   To learn more about what you can do with MyChart, go to [email or web address].    Your next appointment:   We will see you on an as needed basis.  Provider:   Dr. Carolan Clines

## 2022-10-13 DIAGNOSIS — L309 Dermatitis, unspecified: Secondary | ICD-10-CM | POA: Diagnosis not present

## 2022-10-13 DIAGNOSIS — L821 Other seborrheic keratosis: Secondary | ICD-10-CM | POA: Diagnosis not present

## 2022-10-13 DIAGNOSIS — Z411 Encounter for cosmetic surgery: Secondary | ICD-10-CM | POA: Diagnosis not present

## 2022-10-18 DIAGNOSIS — Z136 Encounter for screening for cardiovascular disorders: Secondary | ICD-10-CM | POA: Diagnosis not present

## 2022-10-18 DIAGNOSIS — R9431 Abnormal electrocardiogram [ECG] [EKG]: Secondary | ICD-10-CM

## 2022-10-18 DIAGNOSIS — I480 Paroxysmal atrial fibrillation: Secondary | ICD-10-CM

## 2022-10-30 ENCOUNTER — Other Ambulatory Visit: Payer: Self-pay | Admitting: General Surgery

## 2022-10-30 DIAGNOSIS — K224 Dyskinesia of esophagus: Secondary | ICD-10-CM

## 2022-10-30 DIAGNOSIS — K449 Diaphragmatic hernia without obstruction or gangrene: Secondary | ICD-10-CM | POA: Diagnosis not present

## 2022-11-03 DIAGNOSIS — Z136 Encounter for screening for cardiovascular disorders: Secondary | ICD-10-CM | POA: Diagnosis not present

## 2022-11-11 ENCOUNTER — Telehealth: Payer: Self-pay | Admitting: Internal Medicine

## 2022-11-11 NOTE — Telephone Encounter (Signed)
Patient aware of monitor results 

## 2022-11-11 NOTE — Telephone Encounter (Signed)
Patient is returning call in regards to heart monitor results.

## 2022-11-17 DIAGNOSIS — M461 Sacroiliitis, not elsewhere classified: Secondary | ICD-10-CM | POA: Diagnosis not present

## 2022-11-18 DIAGNOSIS — M0609 Rheumatoid arthritis without rheumatoid factor, multiple sites: Secondary | ICD-10-CM | POA: Diagnosis not present

## 2022-11-19 ENCOUNTER — Ambulatory Visit
Admission: RE | Admit: 2022-11-19 | Discharge: 2022-11-19 | Disposition: A | Discharge status: Home / Self Care | Payer: Medicare HMO | Source: Ambulatory Visit | Attending: General Surgery | Admitting: General Surgery

## 2022-11-19 DIAGNOSIS — K449 Diaphragmatic hernia without obstruction or gangrene: Secondary | ICD-10-CM

## 2022-11-19 DIAGNOSIS — K224 Dyskinesia of esophagus: Secondary | ICD-10-CM

## 2022-12-14 DIAGNOSIS — M0579 Rheumatoid arthritis with rheumatoid factor of multiple sites without organ or systems involvement: Secondary | ICD-10-CM | POA: Diagnosis not present

## 2022-12-14 DIAGNOSIS — E782 Mixed hyperlipidemia: Secondary | ICD-10-CM | POA: Diagnosis not present

## 2022-12-14 DIAGNOSIS — Z0000 Encounter for general adult medical examination without abnormal findings: Secondary | ICD-10-CM | POA: Diagnosis not present

## 2022-12-14 DIAGNOSIS — D509 Iron deficiency anemia, unspecified: Secondary | ICD-10-CM | POA: Diagnosis not present

## 2022-12-14 DIAGNOSIS — Z5181 Encounter for therapeutic drug level monitoring: Secondary | ICD-10-CM | POA: Diagnosis not present

## 2022-12-14 DIAGNOSIS — F5101 Primary insomnia: Secondary | ICD-10-CM | POA: Diagnosis not present

## 2022-12-14 DIAGNOSIS — I700 Atherosclerosis of aorta: Secondary | ICD-10-CM | POA: Diagnosis not present

## 2022-12-14 DIAGNOSIS — K449 Diaphragmatic hernia without obstruction or gangrene: Secondary | ICD-10-CM | POA: Diagnosis not present

## 2022-12-14 DIAGNOSIS — D84821 Immunodeficiency due to drugs: Secondary | ICD-10-CM | POA: Diagnosis not present

## 2022-12-14 DIAGNOSIS — R7301 Impaired fasting glucose: Secondary | ICD-10-CM | POA: Diagnosis not present

## 2022-12-15 ENCOUNTER — Ambulatory Visit
Admission: RE | Admit: 2022-12-15 | Discharge: 2022-12-15 | Disposition: A | Discharge status: Home / Self Care | Payer: Medicare HMO | Source: Ambulatory Visit | Attending: General Surgery | Admitting: General Surgery

## 2022-12-15 DIAGNOSIS — K222 Esophageal obstruction: Secondary | ICD-10-CM | POA: Diagnosis not present

## 2022-12-15 DIAGNOSIS — K449 Diaphragmatic hernia without obstruction or gangrene: Secondary | ICD-10-CM | POA: Diagnosis not present

## 2023-01-08 DIAGNOSIS — N3090 Cystitis, unspecified without hematuria: Secondary | ICD-10-CM | POA: Diagnosis not present

## 2023-01-12 DIAGNOSIS — Z7251 High risk heterosexual behavior: Secondary | ICD-10-CM | POA: Diagnosis not present

## 2023-01-12 DIAGNOSIS — Z01419 Encounter for gynecological examination (general) (routine) without abnormal findings: Secondary | ICD-10-CM | POA: Diagnosis not present

## 2023-01-20 DIAGNOSIS — H40012 Open angle with borderline findings, low risk, left eye: Secondary | ICD-10-CM | POA: Diagnosis not present

## 2023-01-20 DIAGNOSIS — E119 Type 2 diabetes mellitus without complications: Secondary | ICD-10-CM | POA: Diagnosis not present

## 2023-01-22 DIAGNOSIS — R102 Pelvic and perineal pain: Secondary | ICD-10-CM | POA: Diagnosis not present

## 2023-01-22 DIAGNOSIS — R3915 Urgency of urination: Secondary | ICD-10-CM | POA: Diagnosis not present

## 2023-01-22 DIAGNOSIS — R8271 Bacteriuria: Secondary | ICD-10-CM | POA: Diagnosis not present

## 2023-02-22 DIAGNOSIS — M461 Sacroiliitis, not elsewhere classified: Secondary | ICD-10-CM | POA: Diagnosis not present

## 2023-02-26 DIAGNOSIS — R5383 Other fatigue: Secondary | ICD-10-CM | POA: Diagnosis not present

## 2023-02-26 DIAGNOSIS — Z79899 Other long term (current) drug therapy: Secondary | ICD-10-CM | POA: Diagnosis not present

## 2023-02-26 DIAGNOSIS — M0609 Rheumatoid arthritis without rheumatoid factor, multiple sites: Secondary | ICD-10-CM | POA: Diagnosis not present

## 2023-03-26 DIAGNOSIS — K259 Gastric ulcer, unspecified as acute or chronic, without hemorrhage or perforation: Secondary | ICD-10-CM | POA: Diagnosis not present

## 2023-03-26 DIAGNOSIS — K449 Diaphragmatic hernia without obstruction or gangrene: Secondary | ICD-10-CM | POA: Diagnosis not present

## 2023-04-06 DIAGNOSIS — D225 Melanocytic nevi of trunk: Secondary | ICD-10-CM | POA: Diagnosis not present

## 2023-04-06 DIAGNOSIS — L578 Other skin changes due to chronic exposure to nonionizing radiation: Secondary | ICD-10-CM | POA: Diagnosis not present

## 2023-04-06 DIAGNOSIS — L821 Other seborrheic keratosis: Secondary | ICD-10-CM | POA: Diagnosis not present

## 2023-04-06 DIAGNOSIS — D1801 Hemangioma of skin and subcutaneous tissue: Secondary | ICD-10-CM | POA: Diagnosis not present

## 2023-04-06 DIAGNOSIS — Z86018 Personal history of other benign neoplasm: Secondary | ICD-10-CM | POA: Diagnosis not present

## 2023-04-06 DIAGNOSIS — Z85828 Personal history of other malignant neoplasm of skin: Secondary | ICD-10-CM | POA: Diagnosis not present

## 2023-04-06 DIAGNOSIS — D235 Other benign neoplasm of skin of trunk: Secondary | ICD-10-CM | POA: Diagnosis not present

## 2023-04-06 DIAGNOSIS — D229 Melanocytic nevi, unspecified: Secondary | ICD-10-CM | POA: Diagnosis not present

## 2023-04-06 DIAGNOSIS — L570 Actinic keratosis: Secondary | ICD-10-CM | POA: Diagnosis not present

## 2023-04-06 DIAGNOSIS — D485 Neoplasm of uncertain behavior of skin: Secondary | ICD-10-CM | POA: Diagnosis not present

## 2023-04-12 DIAGNOSIS — L570 Actinic keratosis: Secondary | ICD-10-CM | POA: Diagnosis not present

## 2023-04-13 DIAGNOSIS — M461 Sacroiliitis, not elsewhere classified: Secondary | ICD-10-CM | POA: Diagnosis not present

## 2023-04-16 DIAGNOSIS — D509 Iron deficiency anemia, unspecified: Secondary | ICD-10-CM | POA: Diagnosis not present

## 2023-04-19 DIAGNOSIS — Z6828 Body mass index (BMI) 28.0-28.9, adult: Secondary | ICD-10-CM | POA: Diagnosis not present

## 2023-04-19 DIAGNOSIS — M1991 Primary osteoarthritis, unspecified site: Secondary | ICD-10-CM | POA: Diagnosis not present

## 2023-04-19 DIAGNOSIS — Z79899 Other long term (current) drug therapy: Secondary | ICD-10-CM | POA: Diagnosis not present

## 2023-04-19 DIAGNOSIS — M0609 Rheumatoid arthritis without rheumatoid factor, multiple sites: Secondary | ICD-10-CM | POA: Diagnosis not present

## 2023-04-19 DIAGNOSIS — E663 Overweight: Secondary | ICD-10-CM | POA: Diagnosis not present

## 2023-04-20 DIAGNOSIS — R197 Diarrhea, unspecified: Secondary | ICD-10-CM | POA: Diagnosis not present

## 2023-04-20 DIAGNOSIS — M0579 Rheumatoid arthritis with rheumatoid factor of multiple sites without organ or systems involvement: Secondary | ICD-10-CM | POA: Diagnosis not present

## 2023-04-20 DIAGNOSIS — F1020 Alcohol dependence, uncomplicated: Secondary | ICD-10-CM | POA: Diagnosis not present

## 2023-04-29 DIAGNOSIS — D225 Melanocytic nevi of trunk: Secondary | ICD-10-CM | POA: Diagnosis not present

## 2023-04-29 DIAGNOSIS — D485 Neoplasm of uncertain behavior of skin: Secondary | ICD-10-CM | POA: Diagnosis not present

## 2023-05-11 DIAGNOSIS — M461 Sacroiliitis, not elsewhere classified: Secondary | ICD-10-CM | POA: Diagnosis not present

## 2023-05-13 DIAGNOSIS — Z411 Encounter for cosmetic surgery: Secondary | ICD-10-CM | POA: Diagnosis not present

## 2023-05-13 DIAGNOSIS — L821 Other seborrheic keratosis: Secondary | ICD-10-CM | POA: Diagnosis not present

## 2023-05-13 DIAGNOSIS — D485 Neoplasm of uncertain behavior of skin: Secondary | ICD-10-CM | POA: Diagnosis not present

## 2023-05-20 DIAGNOSIS — R3915 Urgency of urination: Secondary | ICD-10-CM | POA: Diagnosis not present

## 2023-05-25 NOTE — Progress Notes (Signed)
Pt. Needs orders for surgery. 

## 2023-05-25 NOTE — Patient Instructions (Addendum)
 DUE TO COVID-19 ONLY TWO VISITORS  (aged 80 and older)  ARE ALLOWED TO COME WITH YOU AND STAY IN THE WAITING ROOM ONLY DURING PRE OP AND PROCEDURE.   **NO VISITORS ARE ALLOWED IN THE SHORT STAY AREA OR RECOVERY ROOM!!**  IF YOU WILL BE ADMITTED INTO THE HOSPITAL YOU ARE ALLOWED ONLY FOUR SUPPORT PEOPLE DURING VISITATION HOURS ONLY (7 AM -8PM)   The support person(s) must pass our screening, gel in and out, and wear a mask at all times, including in the patient's room. Patients must also wear a mask when staff or their support person are in the room. Visitors GUEST BADGE MUST BE WORN VISIBLY  One adult visitor may remain with you overnight and MUST be in the room by 8 P.M.     Your procedure is scheduled on: 06/29/23   Report to [facility] Main Entrance    Report to admitting at : 5:15 AM   Call this number if you have problems the morning of surgery [phone]   Eat a light diet the day before surgery.  Examples including soups, broths, toast, yogurt, mashed potatoes.  Things to avoid include carbonated beverages (fizzy beverages), raw fruits and raw vegetables, or beans.   If your bowels are filled with gas, your surgeon will have difficulty visualizing your pelvic organs which increases your surgical risks.  Do not eat food or drink: After Midnight.  FOLLOW ANY ADDITIONAL PRE OP INSTRUCTIONS YOU RECEIVED FROM YOUR SURGEON'S OFFICE!!!   Oral Hygiene is also important to reduce your risk of infection.                                    Remember - BRUSH YOUR TEETH THE MORNING OF SURGERY WITH YOUR REGULAR TOOTHPASTE  DENTURES WILL BE REMOVED PRIOR TO SURGERY PLEASE DO NOT APPLY "Poly grip" OR ADHESIVES!!!   Do NOT smoke after Midnight   Take these medicines the morning of surgery with A SIP OF WATER: pantoprazole.  Hold vitamins,supplements and over the counter meds 7 days before surgery.  Bring CPAP mask and tubing day of surgery.                              You may  not have any metal on your body including hair pins, jewelry, and body piercing             Do not wear make-up, lotions, powders, perfumes/cologne, or deodorant  Do not wear nail polish including gel and S&S, artificial/acrylic nails, or any other type of covering on natural nails including finger and toenails. If you have artificial nails, gel coating, etc. that needs to be removed by a nail salon please have this removed prior to surgery or surgery may need to be canceled/ delayed if the surgeon/ anesthesia feels like they are unable to be safely monitored.   Do not shave  48 hours prior to surgery.    Do not bring valuables to the hospital. [facility] IS NOT             RESPONSIBLE   FOR VALUABLES.   Contacts, glasses, or bridgework may not be worn into surgery.   Bring small overnight bag day of surgery.   DO NOT BRING YOUR HOME MEDICATIONS TO THE HOSPITAL. PHARMACY WILL DISPENSE MEDICATIONS LISTED ON YOUR MEDICATION LIST TO YOU DURING YOUR  ADMISSION IN THE HOSPITAL!    Patients discharged on the day of surgery will not be allowed to drive home.  Someone NEEDS to stay with you for the first 24 hours after anesthesia.   Special Instructions: Bring a copy of your healthcare power of attorney and living will documents         the day of surgery if you haven't scanned them before.              Please read over the following fact sheets you were given: IF YOU HAVE QUESTIONS ABOUT YOUR PRE-OP INSTRUCTIONS PLEASE CALL [phone]    [facility] - Preparing for Surgery Before surgery, you can play an important role.  Because skin is not sterile, your skin needs to be as free of germs as possible.  You can reduce the number of germs on your skin by washing with CHG (chlorahexidine gluconate) soap before surgery.  CHG is an antiseptic cleaner which kills germs and bonds with the skin to continue killing germs even after washing. Please DO NOT use if you have an allergy to CHG or antibacterial  soaps.  If your skin becomes reddened/irritated stop using the CHG and inform your nurse when you arrive at Short Stay. Do not shave (including legs and underarms) for at least 48 hours prior to the first CHG shower.  You may shave your face/neck. Please follow these instructions carefully:  1.  Shower with CHG Soap the night before surgery and the  morning of Surgery.  2.  If you choose to wash your hair, wash your hair first as usual with your  normal  shampoo.  3.  After you shampoo, rinse your hair and body thoroughly to remove the  shampoo.                           4.  Use CHG as you would any other liquid soap.  You can apply chg directly  to the skin and wash                       Gently with a scrungie or clean washcloth.  5.  Apply the CHG Soap to your body ONLY FROM THE NECK DOWN.   Do not use on face/ open                           Wound or open sores. Avoid contact with eyes, ears mouth and genitals (private parts).                       Wash face,  Genitals (private parts) with your normal soap.             6.  Wash thoroughly, paying special attention to the area where your surgery  will be performed.  7.  Thoroughly rinse your body with warm water from the neck down.  8.  DO NOT shower/wash with your normal soap after using and rinsing off  the CHG Soap.                9.  Pat yourself dry with a clean towel.            10.  Wear clean pajamas.            11.  Place clean sheets on your bed the night of your first shower and  do not  sleep with pets. Day of Surgery : Do not apply any lotions/deodorants the morning of surgery.  Please wear clean clothes to the hospital/surgery center.  FAILURE TO FOLLOW THESE INSTRUCTIONS MAY RESULT IN THE CANCELLATION OF YOUR SURGERY PATIENT SIGNATURE_________________________________  NURSE SIGNATURE__________________________________  ________________________________________________________________________

## 2023-05-26 ENCOUNTER — Ambulatory Visit: Payer: Self-pay | Admitting: General Surgery

## 2023-05-26 DIAGNOSIS — D500 Iron deficiency anemia secondary to blood loss (chronic): Secondary | ICD-10-CM

## 2023-05-26 DIAGNOSIS — K449 Diaphragmatic hernia without obstruction or gangrene: Secondary | ICD-10-CM

## 2023-06-15 ENCOUNTER — Other Ambulatory Visit: Payer: Self-pay | Admitting: Urology

## 2023-06-22 ENCOUNTER — Encounter (HOSPITAL_COMMUNITY)
Admission: RE | Admit: 2023-06-22 | Discharge: 2023-06-22 | Disposition: A | Discharge status: Home / Self Care | Payer: Medicare HMO | Source: Ambulatory Visit | Attending: General Surgery | Admitting: General Surgery

## 2023-06-22 ENCOUNTER — Other Ambulatory Visit: Payer: Self-pay

## 2023-06-22 ENCOUNTER — Encounter (HOSPITAL_COMMUNITY): Payer: Self-pay

## 2023-06-22 VITALS — BP 148/90 | HR 58 | Temp 97.7°F | Ht 68.0 in | Wt 188.0 lb

## 2023-06-22 DIAGNOSIS — Z87891 Personal history of nicotine dependence: Secondary | ICD-10-CM | POA: Insufficient documentation

## 2023-06-22 DIAGNOSIS — D500 Iron deficiency anemia secondary to blood loss (chronic): Secondary | ICD-10-CM | POA: Diagnosis not present

## 2023-06-22 DIAGNOSIS — Z01818 Encounter for other preprocedural examination: Secondary | ICD-10-CM | POA: Diagnosis not present

## 2023-06-22 DIAGNOSIS — Z01812 Encounter for preprocedural laboratory examination: Secondary | ICD-10-CM | POA: Insufficient documentation

## 2023-06-22 DIAGNOSIS — J449 Chronic obstructive pulmonary disease, unspecified: Secondary | ICD-10-CM | POA: Insufficient documentation

## 2023-06-22 DIAGNOSIS — K449 Diaphragmatic hernia without obstruction or gangrene: Secondary | ICD-10-CM

## 2023-06-22 DIAGNOSIS — I4891 Unspecified atrial fibrillation: Secondary | ICD-10-CM | POA: Diagnosis not present

## 2023-06-22 HISTORY — DX: Prediabetes: R73.03

## 2023-06-22 HISTORY — DX: Unspecified atrial fibrillation: I48.91

## 2023-06-22 HISTORY — DX: Rheumatoid arthritis, unspecified: M06.9

## 2023-06-22 HISTORY — DX: Unspecified osteoarthritis, unspecified site: M19.90

## 2023-06-22 HISTORY — DX: Anemia, unspecified: D64.9

## 2023-06-22 LAB — CBC WITH DIFFERENTIAL/PLATELET
Abs Immature Granulocytes: 0.04 10*3/uL (ref 0.00–0.07)
Basophils Absolute: 0 10*3/uL (ref 0.0–0.1)
Basophils Relative: 0 %
Eosinophils Absolute: 0.1 10*3/uL (ref 0.0–0.5)
Eosinophils Relative: 1 %
HCT: 42.4 % (ref 36.0–46.0)
Hemoglobin: 13.4 g/dL (ref 12.0–15.0)
Immature Granulocytes: 1 %
Lymphocytes Relative: 24 %
Lymphs Abs: 1.1 10*3/uL (ref 0.7–4.0)
MCH: 31.2 pg (ref 26.0–34.0)
MCHC: 31.6 g/dL (ref 30.0–36.0)
MCV: 98.8 fL (ref 80.0–100.0)
Monocytes Absolute: 0.4 10*3/uL (ref 0.1–1.0)
Monocytes Relative: 8 %
Neutro Abs: 3.1 10*3/uL (ref 1.7–7.7)
Neutrophils Relative %: 66 %
Platelets: 262 10*3/uL (ref 150–400)
RBC: 4.29 MIL/uL (ref 3.87–5.11)
RDW: 14.1 % (ref 11.5–15.5)
WBC: 4.8 10*3/uL (ref 4.0–10.5)
nRBC: 0 % (ref 0.0–0.2)

## 2023-06-22 LAB — COMPREHENSIVE METABOLIC PANEL
ALT: 19 U/L (ref 0–44)
AST: 22 U/L (ref 15–41)
Albumin: 3.8 g/dL (ref 3.5–5.0)
Alkaline Phosphatase: 40 U/L (ref 38–126)
Anion gap: 8 (ref 5–15)
BUN: 17 mg/dL (ref 8–23)
CO2: 28 mmol/L (ref 22–32)
Calcium: 9.1 mg/dL (ref 8.9–10.3)
Chloride: 103 mmol/L (ref 98–111)
Creatinine, Ser: 1.02 mg/dL — ABNORMAL HIGH (ref 0.44–1.00)
GFR, Estimated: 56 mL/min — ABNORMAL LOW (ref >60)
Glucose, Bld: 112 mg/dL — ABNORMAL HIGH (ref 70–99)
Potassium: 4 mmol/L (ref 3.5–5.1)
Sodium: 139 mmol/L (ref 135–145)
Total Bilirubin: 0.7 mg/dL (ref 0.0–1.2)
Total Protein: 6.6 g/dL (ref 6.5–8.1)

## 2023-06-22 NOTE — Progress Notes (Signed)
 For Anesthesia: PCP - Shon Hale, MD  Cardiologist - Maisie Fus, MD  . Theron Arista: 09/29/22  Bowel Prep reminder:  Chest x-ray -  EKG - 09/29/22 Stress Test -  ECHO -  Cardiac Cath -  Pacemaker/ICD device last checked: Pacemaker orders received: Device Rep notified:  Spinal Cord Stimulator:  Sleep Study -  CPAP -   Fasting Blood Sugar - N/A Checks Blood Sugar _____ times a day Date and result of last Hgb A1c-  Last dose of GLP1 agonist- N/A GLP1 instructions:   Last dose of SGLT-2 inhibitors-  SGLT-2 instructions:   Blood Thinner Instructions:N/A Aspirin Instructions: Last Dose:  Activity level: Can go up a flight of stairs and activities of daily living without stopping and without chest pain and/or shortness of breath   Able to exercise without chest pain and/or shortness of breath  Anesthesia review: A-Fib episode in ED,Rheumatoid arthritis,Pre-DIA.  Patient denies shortness of breath, fever, cough and chest pain at PAT appointment   Patient verbalized understanding of instructions that were given to them at the PAT appointment. Patient was also instructed that they will need to review over the PAT instructions again at home before surgery.

## 2023-06-23 NOTE — Progress Notes (Signed)
 Anesthesia Chart Review   Case: [id number] Date/Time: 06/29/23 0715   Procedures:      XI ROBOTIC ASSISTED HIATAL HERNIA REPAIR WITH POSSIBLE WRAP POSSIBLE MESH     UPPER ENDOSCOPY     CYSTOSCOPY     INJECTION, BULKING AGENT, URETHRA - BULKAMID     BOTOX INJECTION   Anesthesia type: General   Pre-op diagnosis: HIATAL HERNIA   Location: [facility] ROOM 02 / [facility]   Surgeons: Gaynelle Adu, MD; Noel Christmas, MD       DISCUSSION:77 y.o. former smoker with h/o atrial fibrillation, hiatal hernia scheduled for above procedure 06/29/2023 with Dr. Kasandra Knudsen and Dr. Gaynelle Adu.   Pt seen by cardiology 09/28/2022 for preoperative evaluation.  Per OV note, "She can walk > 4 METS without shortness of breath or chest pressure. She is low risk for low risk surgery. She is acceptable risk for hiatal hernia surgery.  She is low risk for low risk surgery. She is acceptable cardiac risk for hiatal hernia surgery."  Zio patch ordered at this visit, results with primarily NSR.  VS: BP (!) 148/90   Pulse (!) 58   Temp 36.5 C (Oral)   Ht 5\' 8"  (1.727 m)   Wt 85.3 kg   SpO2 99%   BMI 28.59 kg/m   PROVIDERS: Shon Hale, MD is PCP    LABS: Labs reviewed: Acceptable for surgery. (all labs ordered are listed, but only abnormal results are displayed)  Labs Reviewed  COMPREHENSIVE METABOLIC PANEL - Abnormal; Notable for the following components:      Result Value   Glucose, Bld 112 (*)    Creatinine, Ser 1.02 (*)    GFR, Estimated 56 (*)    All other components within normal limits  CBC WITH DIFFERENTIAL/PLATELET  TYPE AND SCREEN     IMAGES:   EKG:   CV:  Past Medical History:  Diagnosis Date   A-fib (HCC)    Anemia    Arthritis    Atypical mole 10/31/2013   moderate- midline,chest-margins clear   Atypical mole 09/11/2021   Right Thigh Anterior (severe)   BMI 20.0-20.9, adult    Colon polyp    Dizziness    Fatigue    Hemorrhoids    Hiatal hernia     Pre-diabetes    Rheumatoid arthritis (HCC)    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   Left Lower Leg-anterior (in situ)(CX35FU)   Superficial basal cell carcinoma (BCC) 01/31/2020   Mid tip of nose(CX35FU)    Past Surgical History:  Procedure Laterality Date   CATARACT EXTRACTION, BILATERAL Bilateral    COLONOSCOPY     ESOPHAGOGASTRODUODENOSCOPY ENDOSCOPY     FACIAL COSMETIC SURGERY     SKIN BIOPSY      MEDICATIONS:  atorvastatin (LIPITOR) 20 MG tablet   augmented betamethasone dipropionate (DIPROLENE-AF) 0.05 % cream   baricitinib (OLUMIANT) tablet   pantoprazole (PROTONIX) 40 MG tablet   traZODone (DESYREL) 50 MG tablet   tretinoin (RETIN-A) 0.1 % cream   Vibegron (GEMTESA) 75 MG TABS   No current facility-administered medications for this encounter.    Jodell Cipro Ward, PA-C [facility] [phone]

## 2023-06-24 NOTE — Progress Notes (Signed)
 Toniann Fail from Dr. Tawana Scale office called regarding patient's medicine Olumiant for rheumatoid arthritis, and if it needed to be held prior to surgery.  Discussed with Christeen Douglas, PA-C who stated no issues from an anesthesia standpoint but to discuss with Dr. Andrey Campanile as it could delay healing.  Toniann Fail was advised.

## 2023-06-28 NOTE — Anesthesia Preprocedure Evaluation (Addendum)
 Anesthesia Evaluation  Patient identified by MRN, date of birth, ID band Patient awake    Reviewed: Allergy & Precautions, NPO status , Patient's Chart, lab work & pertinent test results  History of Anesthesia Complications Negative for: history of anesthetic complications  Airway Mallampati: III  TM Distance: >3 FB Neck ROM: Full    Dental  (+) Dental Advisory Given   Pulmonary neg shortness of breath, neg sleep apnea, neg COPD, neg recent URI, former smoker   Pulmonary exam normal breath sounds clear to auscultation       Cardiovascular (-) hypertension(-) angina (-) Past MI, (-) Cardiac Stents and (-) CABG + dysrhythmias (possible AF once, but not seen on Ziopatch)  Rhythm:Regular Rate:Normal  HLD   Neuro/Psych negative neurological ROS     GI/Hepatic Neg liver ROS, hiatal hernia,GERD  Medicated,,  Endo/Other  Pre-diabetes  Renal/GU negative Renal ROS     Musculoskeletal  (+) Arthritis , Rheumatoid disorders,    Abdominal   Peds  Hematology  (+) Blood dyscrasia, anemia Lab Results      Component                Value               Date                      WBC                      4.8                 06/22/2023                HGB                      13.4                06/22/2023                HCT                      42.4                06/22/2023                MCV                      98.8                06/22/2023                PLT                      262                 06/22/2023              Anesthesia Other Findings   Reproductive/Obstetrics                             Anesthesia Physical Anesthesia Plan  ASA: 2  Anesthesia Plan: General   Post-op Pain Management: Tylenol PO (pre-op)*   Induction: Intravenous  PONV Risk Score and Plan: 3 and Ondansetron, Dexamethasone and Treatment may vary due to age or medical condition  Airway Management Planned: Oral  ETT  Additional Equipment:   Intra-op Plan:  Post-operative Plan: Extubation in OR  Informed Consent: I have reviewed the patients History and Physical, chart, labs and discussed the procedure including the risks, benefits and alternatives for the proposed anesthesia with the patient or authorized representative who has indicated his/her understanding and acceptance.     Dental advisory given  Plan Discussed with: CRNA and Anesthesiologist  Anesthesia Plan Comments: (Risks of general anesthesia discussed including, but not limited to, sore throat, hoarse voice, chipped/damaged teeth, injury to vocal cords, nausea and vomiting, allergic reactions, lung infection, heart attack, stroke, and death. All questions answered. )        Anesthesia Quick Evaluation

## 2023-06-29 ENCOUNTER — Ambulatory Visit (HOSPITAL_BASED_OUTPATIENT_CLINIC_OR_DEPARTMENT_OTHER): Payer: Self-pay | Admitting: Anesthesiology

## 2023-06-29 ENCOUNTER — Ambulatory Visit (HOSPITAL_COMMUNITY): Payer: Self-pay | Admitting: Physician Assistant

## 2023-06-29 ENCOUNTER — Encounter (HOSPITAL_COMMUNITY)
Admission: RE | Disposition: A | Discharge status: Home / Self Care | Payer: Self-pay | Source: Home / Self Care | Attending: General Surgery

## 2023-06-29 ENCOUNTER — Other Ambulatory Visit: Payer: Self-pay

## 2023-06-29 ENCOUNTER — Encounter (HOSPITAL_COMMUNITY): Payer: Self-pay | Admitting: General Surgery

## 2023-06-29 ENCOUNTER — Ambulatory Visit (HOSPITAL_COMMUNITY)
Admission: RE | Admit: 2023-06-29 | Discharge: 2023-06-30 | Disposition: A | Discharge status: Home / Self Care | Payer: Medicare HMO | Attending: General Surgery | Admitting: General Surgery

## 2023-06-29 DIAGNOSIS — K449 Diaphragmatic hernia without obstruction or gangrene: Secondary | ICD-10-CM | POA: Diagnosis not present

## 2023-06-29 DIAGNOSIS — F17210 Nicotine dependence, cigarettes, uncomplicated: Secondary | ICD-10-CM | POA: Diagnosis not present

## 2023-06-29 DIAGNOSIS — I4891 Unspecified atrial fibrillation: Secondary | ICD-10-CM | POA: Diagnosis not present

## 2023-06-29 DIAGNOSIS — N3946 Mixed incontinence: Secondary | ICD-10-CM | POA: Diagnosis not present

## 2023-06-29 DIAGNOSIS — Z8711 Personal history of peptic ulcer disease: Secondary | ICD-10-CM | POA: Diagnosis not present

## 2023-06-29 DIAGNOSIS — N3281 Overactive bladder: Secondary | ICD-10-CM | POA: Diagnosis not present

## 2023-06-29 DIAGNOSIS — K2289 Other specified disease of esophagus: Secondary | ICD-10-CM | POA: Diagnosis not present

## 2023-06-29 DIAGNOSIS — Z8719 Personal history of other diseases of the digestive system: Secondary | ICD-10-CM

## 2023-06-29 DIAGNOSIS — Z01818 Encounter for other preprocedural examination: Secondary | ICD-10-CM

## 2023-06-29 HISTORY — PX: XI ROBOTIC ASSISTED HIATAL HERNIA REPAIR: SHX6889

## 2023-06-29 HISTORY — PX: BOTOX INJECTION: SHX5754

## 2023-06-29 HISTORY — PX: CYSTOSCOPY: SHX5120

## 2023-06-29 HISTORY — PX: UPPER GI ENDOSCOPY: SHX6162

## 2023-06-29 LAB — TYPE AND SCREEN
ABO/RH(D): A POS
Antibody Screen: NEGATIVE

## 2023-06-29 LAB — ABO/RH: ABO/RH(D): A POS

## 2023-06-29 SURGERY — REPAIR, HERNIA, HIATAL, ROBOT-ASSISTED
Anesthesia: General

## 2023-06-29 MED ORDER — CHLORHEXIDINE GLUCONATE 0.12 % MT SOLN
15.0000 mL | Freq: Once | OROMUCOSAL | Status: AC
  Administered 2023-06-29: 15 mL via OROMUCOSAL

## 2023-06-29 MED ORDER — TRAZODONE HCL 100 MG PO TABS
100.0000 mg | ORAL_TABLET | Freq: Every day | ORAL | Status: DC
  Administered 2023-06-29: 100 mg via ORAL
  Filled 2023-06-29: qty 1

## 2023-06-29 MED ORDER — LIDOCAINE HCL (CARDIAC) PF 100 MG/5ML IV SOSY
PREFILLED_SYRINGE | INTRAVENOUS | Status: DC | PRN
  Administered 2023-06-29: 80 mg via INTRAVENOUS

## 2023-06-29 MED ORDER — BUPIVACAINE-EPINEPHRINE 0.25% -1:200000 IJ SOLN
INTRAMUSCULAR | Status: DC | PRN
Start: 2023-06-29 — End: 2023-06-29
  Administered 2023-06-29: 60 mL

## 2023-06-29 MED ORDER — PHENYLEPHRINE 80 MCG/ML (10ML) SYRINGE FOR IV PUSH (FOR BLOOD PRESSURE SUPPORT)
PREFILLED_SYRINGE | INTRAVENOUS | Status: DC | PRN
  Administered 2023-06-29 (×4): 160 ug via INTRAVENOUS
  Administered 2023-06-29: 80 ug via INTRAVENOUS

## 2023-06-29 MED ORDER — ONDANSETRON HCL 4 MG/2ML IJ SOLN
INTRAMUSCULAR | Status: AC
  Filled 2023-06-29: qty 2

## 2023-06-29 MED ORDER — PANTOPRAZOLE SODIUM 40 MG IV SOLR
40.0000 mg | Freq: Every day | INTRAVENOUS | Status: DC
  Administered 2023-06-29: 40 mg via INTRAVENOUS
  Filled 2023-06-29: qty 10

## 2023-06-29 MED ORDER — BUPIVACAINE-EPINEPHRINE (PF) 0.25% -1:200000 IJ SOLN
INTRAMUSCULAR | Status: AC
  Filled 2023-06-29: qty 60

## 2023-06-29 MED ORDER — FENTANYL CITRATE PF 50 MCG/ML IJ SOSY
PREFILLED_SYRINGE | INTRAMUSCULAR | Status: AC
  Filled 2023-06-29: qty 1

## 2023-06-29 MED ORDER — MIDAZOLAM HCL 2 MG/2ML IJ SOLN
INTRAMUSCULAR | Status: AC
  Filled 2023-06-29: qty 2

## 2023-06-29 MED ORDER — AMISULPRIDE (ANTIEMETIC) 5 MG/2ML IV SOLN
10.0000 mg | Freq: Once | INTRAVENOUS | Status: AC | PRN
  Administered 2023-06-29: 10 mg via INTRAVENOUS

## 2023-06-29 MED ORDER — SODIUM CHLORIDE (PF) 0.9 % IJ SOLN
INTRAMUSCULAR | Status: DC | PRN
  Administered 2023-06-29: 10 mL

## 2023-06-29 MED ORDER — ENOXAPARIN SODIUM 40 MG/0.4ML IJ SOSY
40.0000 mg | PREFILLED_SYRINGE | INTRAMUSCULAR | Status: DC
  Administered 2023-06-30: 40 mg via SUBCUTANEOUS
  Filled 2023-06-29: qty 0.4

## 2023-06-29 MED ORDER — FENTANYL CITRATE PF 50 MCG/ML IJ SOSY
25.0000 ug | PREFILLED_SYRINGE | INTRAMUSCULAR | Status: DC | PRN
  Administered 2023-06-29: 50 ug via INTRAVENOUS

## 2023-06-29 MED ORDER — LACTATED RINGERS IV SOLN: INTRAVENOUS | Status: DC

## 2023-06-29 MED ORDER — LIDOCAINE HCL (PF) 2 % IJ SOLN
INTRAMUSCULAR | Status: AC
  Filled 2023-06-29: qty 5

## 2023-06-29 MED ORDER — PHENYLEPHRINE 80 MCG/ML (10ML) SYRINGE FOR IV PUSH (FOR BLOOD PRESSURE SUPPORT)
PREFILLED_SYRINGE | INTRAVENOUS | Status: AC
  Filled 2023-06-29: qty 10

## 2023-06-29 MED ORDER — SUGAMMADEX SODIUM 200 MG/2ML IV SOLN
INTRAVENOUS | Status: DC | PRN
  Administered 2023-06-29: 200 mg via INTRAVENOUS

## 2023-06-29 MED ORDER — SCOPOLAMINE 1 MG/3DAYS TD PT72
1.0000 | MEDICATED_PATCH | TRANSDERMAL | Status: DC
  Administered 2023-06-29: 1.5 mg via TRANSDERMAL
  Filled 2023-06-29: qty 1

## 2023-06-29 MED ORDER — SODIUM CHLORIDE 0.9 % IV SOLN: 12.5000 mg | Freq: Four times a day (QID) | INTRAVENOUS | Status: DC | PRN

## 2023-06-29 MED ORDER — SIMETHICONE 80 MG PO CHEW: 40.0000 mg | CHEWABLE_TABLET | Freq: Four times a day (QID) | ORAL | Status: DC | PRN

## 2023-06-29 MED ORDER — ONDANSETRON HCL 4 MG/2ML IJ SOLN
INTRAMUSCULAR | Status: DC | PRN
  Administered 2023-06-29: 4 mg via INTRAVENOUS

## 2023-06-29 MED ORDER — ROCURONIUM BROMIDE 100 MG/10ML IV SOLN
INTRAVENOUS | Status: DC | PRN
  Administered 2023-06-29: 50 mg via INTRAVENOUS
  Administered 2023-06-29 (×2): 20 mg via INTRAVENOUS
  Administered 2023-06-29: 10 mg via INTRAVENOUS

## 2023-06-29 MED ORDER — METHOCARBAMOL 500 MG PO TABS: 500.0000 mg | ORAL_TABLET | Freq: Four times a day (QID) | ORAL | Status: DC | PRN

## 2023-06-29 MED ORDER — ROCURONIUM BROMIDE 10 MG/ML (PF) SYRINGE
PREFILLED_SYRINGE | INTRAVENOUS | Status: AC
  Filled 2023-06-29: qty 10

## 2023-06-29 MED ORDER — SODIUM CHLORIDE (PF) 0.9 % IJ SOLN
INTRAMUSCULAR | Status: AC
  Filled 2023-06-29: qty 50

## 2023-06-29 MED ORDER — MORPHINE SULFATE (PF) 2 MG/ML IV SOLN
1.0000 mg | INTRAVENOUS | Status: DC | PRN
  Administered 2023-06-29: 1 mg via INTRAVENOUS
  Filled 2023-06-29: qty 1

## 2023-06-29 MED ORDER — DEXAMETHASONE SODIUM PHOSPHATE 10 MG/ML IJ SOLN
INTRAMUSCULAR | Status: DC | PRN
Start: 2023-06-29 — End: 2023-06-29
  Administered 2023-06-29: 8 mg via INTRAVENOUS

## 2023-06-29 MED ORDER — PHENYLEPHRINE HCL-NACL 20-0.9 MG/250ML-% IV SOLN
INTRAVENOUS | Status: DC | PRN
Start: 2023-06-29 — End: 2023-06-29
  Administered 2023-06-29: 40 ug/min via INTRAVENOUS

## 2023-06-29 MED ORDER — BARICITINIB 2 MG PO TABS
2.0000 mg | ORAL_TABLET | Freq: Every day | ORAL | Status: DC
  Administered 2023-06-30: 2 mg via ORAL
  Filled 2023-06-29: qty 1

## 2023-06-29 MED ORDER — DIPHENHYDRAMINE HCL 50 MG/ML IJ SOLN: 12.5000 mg | Freq: Four times a day (QID) | INTRAMUSCULAR | Status: DC | PRN

## 2023-06-29 MED ORDER — ONABOTULINUMTOXINA 100 UNITS IJ SOLR
INTRAMUSCULAR | Status: DC | PRN
  Administered 2023-06-29: 100 [IU] via INTRAMUSCULAR

## 2023-06-29 MED ORDER — OXYCODONE HCL 5 MG/5ML PO SOLN: 5.0000 mg | Freq: Once | ORAL | Status: AC | PRN

## 2023-06-29 MED ORDER — FENTANYL CITRATE (PF) 100 MCG/2ML IJ SOLN
INTRAMUSCULAR | Status: AC
  Filled 2023-06-29: qty 2

## 2023-06-29 MED ORDER — ACETAMINOPHEN 500 MG PO TABS
1000.0000 mg | ORAL_TABLET | ORAL | Status: AC
Start: 2023-06-29 — End: 2023-06-29
  Administered 2023-06-29: 1000 mg via ORAL
  Filled 2023-06-29: qty 2

## 2023-06-29 MED ORDER — ONABOTULINUMTOXINA 100 UNITS IJ SOLR
INTRAMUSCULAR | Status: AC
  Filled 2023-06-29: qty 200

## 2023-06-29 MED ORDER — DEXAMETHASONE SODIUM PHOSPHATE 4 MG/ML IJ SOLN: 4.0000 mg | INTRAMUSCULAR | Status: DC

## 2023-06-29 MED ORDER — CEFAZOLIN SODIUM-DEXTROSE 2-4 GM/100ML-% IV SOLN
2.0000 g | INTRAVENOUS | Status: AC
  Administered 2023-06-29: 2 g via INTRAVENOUS
  Filled 2023-06-29: qty 100

## 2023-06-29 MED ORDER — PROPOFOL 10 MG/ML IV BOLUS
INTRAVENOUS | Status: DC | PRN
  Administered 2023-06-29: 80 mg via INTRAVENOUS
  Administered 2023-06-29: 50 mg via INTRAVENOUS

## 2023-06-29 MED ORDER — BUPIVACAINE LIPOSOME 1.3 % IJ SUSP
INTRAMUSCULAR | Status: AC
  Filled 2023-06-29: qty 20

## 2023-06-29 MED ORDER — ONDANSETRON HCL 4 MG/2ML IJ SOLN: 4.0000 mg | Freq: Four times a day (QID) | INTRAMUSCULAR | Status: DC | PRN

## 2023-06-29 MED ORDER — MIRABEGRON ER 25 MG PO TB24
25.0000 mg | ORAL_TABLET | Freq: Every day | ORAL | Status: DC
  Administered 2023-06-29 – 2023-06-30 (×2): 25 mg via ORAL
  Filled 2023-06-29 (×2): qty 1

## 2023-06-29 MED ORDER — ORAL CARE MOUTH RINSE: 15.0000 mL | Freq: Once | OROMUCOSAL | Status: AC

## 2023-06-29 MED ORDER — HEPARIN SODIUM (PORCINE) 5000 UNIT/ML IJ SOLN
5000.0000 [IU] | Freq: Once | INTRAMUSCULAR | Status: AC
  Administered 2023-06-29: 5000 [IU] via SUBCUTANEOUS
  Filled 2023-06-29: qty 1

## 2023-06-29 MED ORDER — CHLORHEXIDINE GLUCONATE CLOTH 2 % EX PADS: 6.0000 | MEDICATED_PAD | Freq: Once | CUTANEOUS | Status: DC

## 2023-06-29 MED ORDER — ACETAMINOPHEN 500 MG PO TABS
1000.0000 mg | ORAL_TABLET | Freq: Four times a day (QID) | ORAL | Status: DC
  Administered 2023-06-29 – 2023-06-30 (×5): 1000 mg via ORAL
  Filled 2023-06-29 (×5): qty 2

## 2023-06-29 MED ORDER — 0.9 % SODIUM CHLORIDE (POUR BTL) OPTIME
TOPICAL | Status: DC | PRN
  Administered 2023-06-29: 1000 mL

## 2023-06-29 MED ORDER — OXYCODONE HCL 5 MG PO TABS
5.0000 mg | ORAL_TABLET | ORAL | Status: DC | PRN
  Administered 2023-06-29 (×2): 10 mg via ORAL
  Filled 2023-06-29 (×2): qty 2

## 2023-06-29 MED ORDER — MIDAZOLAM HCL 2 MG/2ML IJ SOLN
INTRAMUSCULAR | Status: DC | PRN
  Administered 2023-06-29: 2 mg via INTRAVENOUS

## 2023-06-29 MED ORDER — OXYCODONE HCL 5 MG PO TABS: 5.0000 mg | ORAL_TABLET | Freq: Once | ORAL | Status: AC | PRN

## 2023-06-29 MED ORDER — AMISULPRIDE (ANTIEMETIC) 5 MG/2ML IV SOLN
INTRAVENOUS | Status: AC
Start: 2023-06-29 — End: 2023-06-30
  Filled 2023-06-29: qty 4

## 2023-06-29 MED ORDER — KCL IN DEXTROSE-NACL 20-5-0.45 MEQ/L-%-% IV SOLN
INTRAVENOUS | Status: AC
  Filled 2023-06-29 (×2): qty 1000

## 2023-06-29 MED ORDER — LACTATED RINGERS IV SOLN
INTRAVENOUS | Status: DC | PRN
Start: 2023-06-29 — End: 2023-06-29
  Administered 2023-06-29: 1000 mL

## 2023-06-29 MED ORDER — STERILE WATER FOR IRRIGATION IR SOLN
Status: DC | PRN
  Administered 2023-06-29: 1

## 2023-06-29 MED ORDER — PROPOFOL 10 MG/ML IV BOLUS
INTRAVENOUS | Status: AC
  Filled 2023-06-29: qty 20

## 2023-06-29 MED ORDER — FENTANYL CITRATE (PF) 100 MCG/2ML IJ SOLN
INTRAMUSCULAR | Status: DC | PRN
  Administered 2023-06-29 (×3): 50 ug via INTRAVENOUS

## 2023-06-29 MED ORDER — DIPHENHYDRAMINE HCL 12.5 MG/5ML PO ELIX: 12.5000 mg | ORAL_SOLUTION | Freq: Four times a day (QID) | ORAL | Status: DC | PRN

## 2023-06-29 MED ORDER — OXYCODONE HCL 5 MG PO TABS
ORAL_TABLET | ORAL | Status: AC
  Administered 2023-06-29: 5 mg via ORAL
  Filled 2023-06-29: qty 1

## 2023-06-29 MED ORDER — ONDANSETRON 4 MG PO TBDP: 4.0000 mg | ORAL_TABLET | Freq: Four times a day (QID) | ORAL | Status: DC | PRN

## 2023-06-29 MED ORDER — DEXAMETHASONE SODIUM PHOSPHATE 10 MG/ML IJ SOLN
INTRAMUSCULAR | Status: AC
  Filled 2023-06-29: qty 1

## 2023-06-29 MED ORDER — SUGAMMADEX SODIUM 200 MG/2ML IV SOLN
INTRAVENOUS | Status: AC
  Filled 2023-06-29: qty 2

## 2023-06-29 SURGICAL SUPPLY — 83 items
ANTIFOG SOL W/FOAM PAD STRL (MISCELLANEOUS) ×1 IMPLANT
APPLIER CLIP 5 13 M/L LIGAMAX5 (MISCELLANEOUS) IMPLANT
APPLIER CLIP ROT 10 11.4 M/L (STAPLE) IMPLANT
BAG URO CATCHER STRL LF (MISCELLANEOUS) ×1 IMPLANT
BLADE SURG SZ11 CARB STEEL (BLADE) ×1 IMPLANT
CATH URETL OPEN END 6FR 70 (CATHETERS) ×1 IMPLANT
CHLORAPREP W/TINT 26 (MISCELLANEOUS) ×1 IMPLANT
CLIP APPLIE 5 13 M/L LIGAMAX5 (MISCELLANEOUS) IMPLANT
CLIP APPLIE ROT 10 11.4 M/L (STAPLE) IMPLANT
CLOTH BEACON ORANGE TIMEOUT ST (SAFETY) ×1 IMPLANT
COVER SURGICAL LIGHT HANDLE (MISCELLANEOUS) ×1 IMPLANT
COVER TIP SHEARS 8 DVNC (MISCELLANEOUS) IMPLANT
DERMABOND ADVANCED .7 DNX12 (GAUZE/BANDAGES/DRESSINGS) IMPLANT
DRAIN PENROSE 0.5X18 (DRAIN) IMPLANT
DRAPE ARM DVNC X/XI (DISPOSABLE) ×4 IMPLANT
DRAPE COLUMN DVNC XI (DISPOSABLE) ×1 IMPLANT
DRIVER NDL LRG 8 DVNC XI (INSTRUMENTS) ×1 IMPLANT
DRIVER NDL MEGA SUTCUT DVNCXI (INSTRUMENTS) ×1 IMPLANT
DRIVER NDLE LRG 8 DVNC XI (INSTRUMENTS) IMPLANT
DRIVER NDLE MEGA SUTCUT DVNCXI (INSTRUMENTS) ×1 IMPLANT
DRSG TEGADERM 2-3/8X2-3/4 SM (GAUZE/BANDAGES/DRESSINGS) ×6 IMPLANT
ELECT REM PT RETURN 15FT ADLT (MISCELLANEOUS) ×2 IMPLANT
FORCEPS CADIERE DVNC XI (FORCEP) IMPLANT
GAUZE 4X4 16PLY ~~LOC~~+RFID DBL (SPONGE) ×1 IMPLANT
GAUZE SPONGE 2X2 8PLY STRL LF (GAUZE/BANDAGES/DRESSINGS) ×1 IMPLANT
GLOVE BIO SURGEON STRL SZ 6.5 (GLOVE) ×1 IMPLANT
GLOVE BIO SURGEON STRL SZ7.5 (GLOVE) ×2 IMPLANT
GLOVE INDICATOR 8.0 STRL GRN (GLOVE) ×2 IMPLANT
GOWN STRL REUS W/ TWL LRG LVL3 (GOWN DISPOSABLE) ×1 IMPLANT
GOWN STRL REUS W/ TWL XL LVL3 (GOWN DISPOSABLE) IMPLANT
GRASPER SUT TROCAR 14GX15 (MISCELLANEOUS) IMPLANT
GRASPER TIP-UP FEN DVNC XI (INSTRUMENTS) ×1 IMPLANT
GUIDEWIRE STR DUAL SENSOR (WIRE) ×1 IMPLANT
IRRIG SUCT STRYKERFLOW 2 WTIP (MISCELLANEOUS) ×1 IMPLANT
IRRIGATION SUCT STRKRFLW 2 WTP (MISCELLANEOUS) ×1 IMPLANT
KIT BASIN OR (CUSTOM PROCEDURE TRAY) ×1 IMPLANT
KIT GASTRIC LAVAGE 34FR ADT (SET/KITS/TRAYS/PACK) IMPLANT
KIT TURNOVER KIT A (KITS) ×1 IMPLANT
LUBRICANT JELLY K Y 4OZ (MISCELLANEOUS) IMPLANT
MANIFOLD NEPTUNE II (INSTRUMENTS) ×1 IMPLANT
MARKER SKIN DUAL TIP RULER LAB (MISCELLANEOUS) IMPLANT
MESH BIO-A 7X10 SYN MAT (Mesh General) IMPLANT
NDL ASPIRATION 22 (NEEDLE) ×1 IMPLANT
NDL HYPO 22X1.5 SAFETY MO (MISCELLANEOUS) ×1 IMPLANT
NDL SAFETY ECLIPSE 18X1.5 (NEEDLE) ×1 IMPLANT
NEEDLE ASPIRATION 22 (NEEDLE) ×1 IMPLANT
NEEDLE HYPO 22X1.5 SAFETY MO (MISCELLANEOUS) ×1 IMPLANT
OBTURATOR OPTICAL STND 8 DVNC (TROCAR) ×1 IMPLANT
OBTURATOR OPTICALSTD 8 DVNC (TROCAR) ×1 IMPLANT
PACK CARDIOVASCULAR III (CUSTOM PROCEDURE TRAY) ×1 IMPLANT
PACK CYSTO (CUSTOM PROCEDURE TRAY) ×1 IMPLANT
PAD POSITIONING PINK XL (MISCELLANEOUS) ×1 IMPLANT
SCISSORS LAP 5X35 DISP (ENDOMECHANICALS) IMPLANT
SCISSORS MNPLR CVD DVNC XI (INSTRUMENTS) ×1 IMPLANT
SEAL UNIV 5-12 XI (MISCELLANEOUS) ×3 IMPLANT
SEALER VESSEL EXT DVNC XI (MISCELLANEOUS) ×1 IMPLANT
SOL ELECTROSURG ANTI STICK (MISCELLANEOUS) IMPLANT
SOLUTION ANTFG W/FOAM PAD STRL (MISCELLANEOUS) ×1 IMPLANT
SOLUTION ELECTROSURG ANTI STCK (MISCELLANEOUS) ×1 IMPLANT
SPIKE FLUID TRANSFER (MISCELLANEOUS) ×1 IMPLANT
STRIP CLOSURE SKIN 1/2X4 (GAUZE/BANDAGES/DRESSINGS) ×1 IMPLANT
SUT ETHIBOND 0 36 GRN (SUTURE) ×3 IMPLANT
SUT MNCRL AB 4-0 PS2 18 (SUTURE) ×1 IMPLANT
SUT SILK 0 SH 30 (SUTURE) IMPLANT
SUT SILK 2 0 SH (SUTURE) IMPLANT
SUT VIC AB 0 UR5 27 (SUTURE) IMPLANT
SUT VIC AB 3-0 SH 27XBRD (SUTURE) IMPLANT
SYR 20ML ECCENTRIC (SYRINGE) ×1 IMPLANT
SYR 20ML LL LF (SYRINGE) ×2 IMPLANT
SYR CONTROL 10ML LL (SYRINGE) ×1 IMPLANT
SYSTEM URETHRAL BULK BULKAMID (Female Continence) IMPLANT
TIP INNERVISION DETACH 40FR (MISCELLANEOUS) IMPLANT
TIP INNERVISION DETACH 50FR (MISCELLANEOUS) IMPLANT
TIP INNERVISION DETACH 56FR (MISCELLANEOUS) IMPLANT
TOWEL OR 17X26 10 PK STRL BLUE (TOWEL DISPOSABLE) ×1 IMPLANT
TRAY FOLEY MTR SLVR 16FR STAT (SET/KITS/TRAYS/PACK) IMPLANT
TROCAR ADV FIXATION 12X100MM (TROCAR) IMPLANT
TROCAR ADV FIXATION 5X100MM (TROCAR) IMPLANT
TROCAR XCEL NON-BLD 5MMX100MML (ENDOMECHANICALS) ×1 IMPLANT
TUBING CONNECTING 10 (TUBING) ×1 IMPLANT
TUBING INSUFFLATION 10FT LAP (TUBING) ×1 IMPLANT
TUBING UROLOGY SET (TUBING) ×1 IMPLANT
WATER STERILE IRR 3000ML UROMA (IV SOLUTION) ×1 IMPLANT

## 2023-06-29 NOTE — Transfer of Care (Signed)
 Immediate Anesthesia Transfer of Care Note  Patient: Natalie Bradshaw  Procedure(s) Performed: XI ROBOTIC ASSISTED HIATAL HERNIA REPAIR WITH POSSIBLE WRAP POSSIBLE MESH UPPER ENDOSCOPY CYSTOSCOPY INJECTION, BULKING AGENT, URETHRA BOTOX INJECTION  Patient Location: PACU  Anesthesia Type:General  Level of Consciousness: drowsy  Airway & Oxygen Therapy: Patient Spontanous Breathing and Patient connected to face mask oxygen  Post-op Assessment: Report given to RN and Post -op Vital signs reviewed and stable  Post vital signs: Reviewed and stable  Last Vitals:  Vitals Value Taken Time  BP 155/90 06/29/23 1134  Temp    Pulse 68 06/29/23 1137  Resp 16 06/29/23 1137  SpO2 100 % 06/29/23 1137  Vitals shown include unfiled device data.  Last Pain:  Vitals:   06/29/23 0626  TempSrc:   PainSc: 0-No pain         Complications: No notable events documented.

## 2023-06-29 NOTE — Anesthesia Procedure Notes (Signed)
 Procedure Name: Intubation Date/Time: 06/29/2023 7:47 AM  Performed by: Jamelle Rushing, CRNAPre-anesthesia Checklist: Patient identified, Emergency Drugs available, Suction available, Patient being monitored and Timeout performed Patient Re-evaluated:Patient Re-evaluated prior to induction Oxygen Delivery Method: Circle system utilized Preoxygenation: Pre-oxygenation with 100% oxygen Induction Type: IV induction Ventilation: Mask ventilation without difficulty Laryngoscope Size: Mac and 3 Grade View: Grade II Tube type: Oral Tube size: 7.0 mm Number of attempts: 1 Airway Equipment and Method: Stylet Placement Confirmation: ETT inserted through vocal cords under direct vision, positive ETCO2, CO2 detector and breath sounds checked- equal and bilateral Secured at: 21 cm Tube secured with: Tape Dental Injury: Teeth and Oropharynx as per pre-operative assessment

## 2023-06-29 NOTE — Plan of Care (Signed)
   Problem: Education: Goal: Knowledge of General Education information will improve Description: Including pain rating scale, medication(s)/side effects and non-pharmacologic comfort measures Outcome: Progressing   Problem: Activity: Goal: Risk for activity intolerance will decrease Outcome: Progressing

## 2023-06-29 NOTE — Anesthesia Postprocedure Evaluation (Signed)
 Anesthesia Post Note  Patient: [employer] operational [employer]  Procedure(s) Performed: XI ROBOTIC ASSISTED HIATAL HERNIA REPAIR WITH POSSIBLE WRAP POSSIBLE MESH UPPER ENDOSCOPY CYSTOSCOPY INJECTION, BULKING AGENT, URETHRA BOTOX INJECTION     Patient location during evaluation: PACU Anesthesia Type: General Level of consciousness: awake Pain management: pain level controlled Vital Signs Assessment: post-procedure vital signs reviewed and stable Respiratory status: spontaneous breathing, nonlabored ventilation and respiratory function stable Cardiovascular status: blood pressure returned to baseline and stable Postop Assessment: no apparent nausea or vomiting Anesthetic complications: no   No notable events documented.  Last Vitals:  Vitals:   06/29/23 1200 06/29/23 1215  BP: 134/75 127/86  Pulse: (!) 58 (!) 59  Resp: 11 15  Temp:    SpO2: 98% 97%    Last Pain:  Vitals:   06/29/23 1215  TempSrc:   PainSc: 3                  Linton Rump

## 2023-06-29 NOTE — Addendum Note (Signed)
 Addendum  created 06/29/23 1254 by Jamelle Rushing, CRNA   Flowsheet accepted

## 2023-06-29 NOTE — Discharge Instructions (Signed)
 Cystoscopy with Botox and Bulkamid patient instructions  Following a cystoscopy, a catheter (a flexible rubber tube) is sometimes left in place to empty the bladder. This may cause some discomfort or a feeling that you need to urinate. Your doctor determines the period of time that the catheter will be left in place. You may have bloody urine for two to three days (Call your doctor if the amount of bleeding increases or does not subside).  You may pass blood clots in your urine, especially if you had a biopsy. It is not unusual to pass small blood clots and have some bloody urine a couple of weeks after your cystoscopy. Again, call your doctor if the bleeding does not subside. You may have: Dysuria (painful urination) Frequency (urinating often) Urgency (strong desire to urinate)  These symptoms are common especially if medicine is instilled into the bladder or a ureteral stent is placed. Avoiding alcohol and caffeine, such as coffee, tea, and chocolate, may help relieve these symptoms. Drink plenty of water, unless otherwise instructed. Your doctor may also prescribe an antibiotic or other medicine to reduce these symptoms.  Cystoscopy results are available soon after the procedure; biopsy results usually take two to four days. Your doctor will discuss the results of your exam with you. Before you go home, you will be given specific instructions for follow-up care. Special Instructions:   If you are going home with a catheter in place do not take a tub bath until removed by your doctor.   You may resume your normal activities.   Do not drive or operate machinery if you are taking narcotic pain medicine.   Be sure to keep all follow-up appointments with your doctor.   Call Your Doctor If: The catheter is not draining You have severe pain You are unable to urinate You have a fever over 101 You have severe bleeding          EATING AFTER YOUR ESOPHAGEAL SURGERY (Stomach Fundoplication,  Hiatal Hernia repair, Achalasia surgery, etc)  ######################################################################  EAT Start with a full liquid diet (see below) Gradually transition to a high fiber diet with a fiber supplement over the next month after discharge.    WALK Walk an hour a day.  Control your pain to do that.    CONTROL PAIN Control pain so that you can walk, sleep, tolerate sneezing/coughing, go up/down stairs.  HAVE A BOWEL MOVEMENT DAILY Keep your bowels regular to avoid problems.  OK to try a laxative to override constipation.  OK to use an antidairrheal to slow down diarrhea.  Call if not better after 2 tries  CALL IF YOU HAVE PROBLEMS/CONCERNS Call if you are still struggling despite following these instructions. Call if you have concerns not answered by these instructions  ######################################################################   After your esophageal surgery, expect some sticking with swallowing over the next 1-2 months.    If food sticks when you eat, it is called "dysphagia".  This is due to swelling around your esophagus at the wrap & hiatal diaphragm repair.  It will gradually ease off over the next few months.  To help you through this temporary phase, we start you out on a full liquid diet.  Your first meal in the hospital was thin liquids.  You should have been given a full liquid diet by the time you left the hospital. Stay on clears and full liquids for the first week. Some patients may need to stay on a liquid diet for up to  2 weeks if having trouble swallowing.  Once tolerating that well, you can advance to pureed diet.   We ask patients to stay on a pureed diet for the 2nd-3rd week to avoid anything getting "stuck" near your recent surgery.  Don't be alarmed if your ability to swallow doesn't progress according to this plan.  Everyone is different and some diets can advance more or less quickly.    It is often helpful to crush your  medications or split them as they can sometimes stick, especially the first week or so.   Some BASIC RULES to follow are: Maintain an upright position whenever eating or drinking. Take small bites - just a teaspoon size bite at a time. Eat slowly.  It may also help to eat only one food at a time. Consider nibbling through smaller, more frequent meals & avoid the urge to eat BIG meals Do not push through feelings of fullness, nausea, or bloatedness Do not mix solid foods and liquids in the same mouthful Try not to "wash foods down" with large gulps of liquids. Avoid carbonated (bubbly/fizzy) drinks.   Avoid foods that make you feel gassy or bloated.  Start with bland foods first.  Wait on trying greasy, fried, or spicy meals until you are tolerating more bland solids well. Understand that it will be hard to burp and belch at first.  This gradually improves with time.  Expect to be more gassy/flatulent/bloated initially.  Walking will help your body manage it better. Consider using medications for bloating that contain simethicone such as  Maalox or Gas-X  Consider crushing her medications, especially smaller pills.  The ability to swallow pills should get easier after a few weeks Eat in a relaxed atmosphere & minimize distractions. Avoid talking while eating.   Do not use straws. Following each meal, sit in an upright position (90 degree angle) for 60 to 90 minutes.  Going for a short walk can help as well If food does stick, don't panic.  Try to relax and let the food pass on its own.  Sipping WARM LIQUID such as strong hot black tea can also help slide it down.   Be gradual in changes & use common sense:  -If you easily tolerating a certain "level" of foods, advance to the next level gradually -If you are having trouble swallowing a particular food, then avoid it.   -If food is sticking when you advance your diet, go back to thinner previous diet (the lower LEVEL) for 1-2 days.  LEVEL 2  = PUREED DIET  Start 1- 2 WEEKS AFTER SURGERY IF YOU ARE TOLERATING A FULL LIQUID DIET EASILY  -Foods in this group are pureed or blenderized to a smooth, mashed potato-like consistency.  -If necessary, the pureed foods can keep their shape with the addition of a thickening agent.   -Meat should be pureed to a smooth, pasty consistency.  Hot broth or gravy may be added to the pureed meat, approximately 1 oz. of liquid per 3 oz. serving of meat. -CAUTION:  If any foods do not puree into a smooth consistency, swallowing will be more difficult.  (For example, nuts or seeds sometimes do not blend well.)  Hot Foods Cold Foods  Pureed scrambled eggs and cheese Pureed cottage cheese  Baby cereals Thickened juices and nectars  Thinned cooked cereals (no lumps) Thickened milk or eggnog  Pureed [place] toast or pancakes Ensure  Mashed potatoes Ice cream  Pureed parsley, au gratin, scalloped potatoes, candied sweet  potatoes Fruit or [place] ice, sherbet  Pureed buttered or alfredo noodles Plain yogurt  Pureed vegetables (no corn or peas) Instant breakfast  Pureed soups and creamed soups Smooth pudding, mousse, custard  Pureed scalloped apples Whipped gelatin  Gravies Sugar, syrup, honey, jelly  Sauces, cheese, tomato, barbecue, white, creamed Cream  Any baby food Creamer  Alcohol in moderation (not beer or champagne) Margarine  Coffee or tea Mayonnaise   Ketchup, mustard   Apple sauce   SAMPLE MENU:  PUREED DIET Breakfast Lunch Dinner  Orange juice, 1/2 cup Cream of wheat, 1/2 cup Pineapple juice, 1/2 cup Pureed [place], barley soup, 3/4 cup Pureed Hawaiian chicken, 3 oz  Scrambled eggs, mashed or blended with cheese, 1/2 cup Tea or coffee, 1 cup  Whole milk, 1 cup  Non-dairy creamer, 2 Tbsp. Mashed potatoes, 1/2 cup Pureed cooled broccoli, 1/2 cup Apple sauce, 1/2 cup Coffee or tea Mashed potatoes, 1/2 cup Pureed spinach, 1/2 cup Frozen yogurt, 1/2 cup Tea or coffee      LEVEL 3 =  SOFT DIET  After your first 4 weeks, you can advance to a soft diet.   Keep on this diet until everything goes down easily.  Hot Foods Cold Foods  White fish Cottage cheese  Stuffed fish Junior baby fruit  Baby food meals Semi thickened juices  Minced soft cooked, scrambled, poached eggs nectars  Souffle & omelets Ripe mashed bananas  Cooked cereals Canned fruit, pineapple sauce, milk  potatoes Milkshake  Buttered or Alfredo noodles Custard  Cooked cooled vegetable Puddings, including tapioca  Sherbet Yogurt  Vegetable soup or alphabet soup Fruit ice, [place] ice  Gravies Whipped gelatin  Sugar, syrup, honey, jelly Junior baby desserts  Sauces:  Cheese, creamed, barbecue, tomato, white Cream  Coffee or tea Margarine   SAMPLE MENU:  LEVEL 3 Breakfast Lunch Dinner  Orange juice, 1/2 cup Oatmeal, 1/2 cup Scrambled eggs with cheese, 1/2 cup Decaffeinated tea, 1 cup Whole milk, 1 cup Non-dairy creamer, 2 Tbsp Pineapple juice, 1/2 cup Minced beef, 3 oz Gravy, 2 Tbsp Mashed potatoes, 1/2 cup Minced fresh broccoli, 1/2 cup Applesauce, 1/2 cup Coffee, 1 cup [place], barley soup, 3/4 cup Minced Hawaiian chicken, 3 oz Mashed potatoes, 1/2 cup Cooked spinach, 1/2 cup Frozen yogurt, 1/2 cup Non-dairy creamer, 2 Tbsp      LEVEL 4 = CHOPPED DIET  -After all the foods in level 3 (soft diet) are passing through well you should advance up to more chopped foods.  -It is still important to cut these foods into small pieces and eat slowly.  Hot Foods Cold Foods  Poultry Cottage cheese  Chopped Swedish meatballs Yogurt  Meat salads (ground or flaked meat) Milk  Flaked fish (tuna) Milkshakes  Poached or scrambled eggs Soft, cold, dry cereal  Souffles and omelets Fruit juices or nectars  Cooked cereals Chopped canned fruit  Chopped [place] toast or pancakes Canned fruit cocktail  Noodles or pasta (no rice) Pudding, mousse, custard  Cooked vegetables (no frozen peas, corn, or mixed  vegetables) Green salad  Canned small sweet peas Ice cream  Creamed soup or vegetable soup Fruit ice, [place] ice  Pureed vegetable soup or alphabet soup Non-dairy creamer  Ground scalloped apples Margarine  Gravies Mayonnaise  Sauces:  Cheese, creamed, barbecue, tomato, white Ketchup  Coffee or tea Mustard   SAMPLE MENU:  LEVEL 4 Breakfast Lunch Dinner  Orange juice, 1/2 cup Oatmeal, 1/2 cup Scrambled eggs with cheese, 1/2 cup Decaffeinated tea, 1 cup Whole  milk, 1 cup Non-dairy creamer, 2 Tbsp Ketchup, 1 Tbsp Margarine, 1 tsp Salt, 1/4 tsp Sugar, 2 tsp Pineapple juice, 1/2 cup Ground beef, 3 oz Gravy, 2 Tbsp Mashed potatoes, 1/2 cup Cooked spinach, 1/2 cup Applesauce, 1/2 cup Decaffeinated coffee Whole milk Non-dairy creamer, 2 Tbsp Margarine, 1 tsp Salt, 1/4 tsp Pureed [place], barley soup, 3/4 cup Barbecue chicken, 3 oz Mashed potatoes, 1/2 cup Ground fresh broccoli, 1/2 cup Frozen yogurt, 1/2 cup Decaffeinated tea, 1 cup Non-dairy creamer, 2 Tbsp Margarine, 1 tsp Salt, 1/4 tsp Sugar, 1 tsp    LEVEL 5:  REGULAR FOODS  -Foods in this group are soft, moist, regularly textured foods.   -This level includes meat and breads, which tend to be the hardest things to swallow.   -Eat very slowly, chew well and continue to avoid carbonated drinks. -most people are at this level in 6 weeks  Hot Foods Cold Foods  Baked fish or skinned Soft cheeses - cottage cheese  Souffles and omelets Cream cheese  Eggs Yogurt  Stuffed shells Milk  Spaghetti with meat sauce Milkshakes  Cooked cereal Cold dry cereals (no nuts, dried fruit, coconut)  [place] toast or pancakes Crackers  Buttered toast Fruit juices or nectars  Noodles or pasta (no rice) Canned fruit  Potatoes (all types) Ripe bananas  Soft, cooked vegetables (no corn, lima, or baked beans) Peeled, ripe, fresh fruit  Creamed soups or vegetable soup Cakes (no nuts, dried fruit, coconut)  Canned chicken noodle soup Plain  doughnuts  Gravies Ice cream  Bacon dressing Pudding, mousse, custard  Sauces:  Cheese, creamed, barbecue, tomato, white Fruit ice, [place] ice, sherbet  Decaffeinated tea or coffee Whipped gelatin  Pork chops Regular gelatin   Canned fruited gelatin molds   Sugar, syrup, honey, jam, jelly   Cream   Non-dairy   Margarine   Oil   Mayonnaise   Ketchup   Mustard   TROUBLESHOOTING IRREGULAR BOWELS  1) Avoid extremes of bowel movements (no bad constipation/diarrhea)  2) Miralax 17gm mixed in 8oz. water or juice-daily. May use BID as needed.  3) Gas-x,Phazyme, etc. as needed for gas & bloating.  4) Soft,bland diet. No spicy,greasy,fried foods.  5) Prilosec over-the-counter as needed  6) May hold gluten/wheat products from diet to see if symptoms improve.  7) May try probiotics (Align, Activa, etc) to help calm the bowels down  7) If symptoms become worse call back immediately.    If you have any questions please call our office at [facility]: [phone].

## 2023-06-29 NOTE — Op Note (Signed)
 PRE-OPERATIVE DIAGNOSIS:  LARGE HIATAL HERNIA; h/o cameron ulcer and anemia   POST-OPERATIVE DIAGNOSIS:  LARGE TYPE III HIATAL HERNIA; foreshortened esophagus   PROCEDURE:  Procedure(s): XI ROBOTIC ASSISTED  TYPE III HIATAL HERNIA REPAIR WITH MESH and GASTROPEXY AND BILATERAL TAP BLOCK ESOPHAGOGASTRODUODENOSCOPY (EGD)   SURGEON:  Surgeon(s): Gaynelle Adu, MD     ASSISTANTS: Phylliss Blakes MD FACS (a qualified assistant was requested and needed for tissue retraction and mobilization and assistance with the case due to the complexity of the anatomy)   ANESTHESIA:   general   DRAINS: none   EBL: minimal   LOCAL MEDICATIONS USED:  MARCAINE       SPECIMEN:  Source of Specimen:  HERNIA SAC   DISPOSITION OF SPECIMEN:   DISCARDED   COUNTS:  YES   INDICATION FOR PROCEDURE: 78 yo female was referred for management of a large type III hiatal hernia.  She has a history of Cameron erosions and anemia.  We had an extensive conversations regarding surgical repair.  We discussed potentially not being able to do a fundoplication if we were unable to achieve adequate intra-abdominal esophageal length.  We also discussed potential mesh insertion.  Please see chart for additional details   PROCEDURE: Patient received 5000 units of subcutaneous heparin preoperatively.  Patient was given Tylenol and Decadron and Zofran preoperatively.  After informed consent was obtained patient was taken to the operating room at [facility].  They were placed upon on the operating room table.  General endotracheal anesthesia was established.  Sequential compression devices were placed.  Usual standard surgical fashion with ChloraPrep.  The patient was on IV antibiotic prior to skin incision.  A surgical timeout was performed.   Access to the abdomen was obtained using the Optiview technique in the left mid abdomen about 8 cm to the left of the umbilicus in the midclavicular line.  Using a 0 degree 5 mm  laparoscope through a 5 mm trocar it was advanced through all layers of the abdominal wall and the abdominal cavity was carefully entered.  Pneumoperitoneum was smoothly established without any change in patient vital signs.  The laparoscope was advanced and the abdominal cavity was surveilled.  There was no evidence of injury.  Patient was placed in reverse Trendelenburg.  A robotic 8 trocar was placed just above to the umbilicus.  An additional 8 mm robotic trocar was placed in the right mid abdomen and a assistant 5 mm trocar was placed in the right lateral abdominal wall.  We exchanged the Optiview trocar for a robotic 8 trocar and a final 8 mm robotic trocar was placed in the left lateral abdominal wall.  A bilateral laparoscopic tap block was performed for postoperative pain relief.  A Nathanson liver retractor was placed through a subxiphoid small incision to lift up the left lobe of the liver providing excellent visualization of the diaphragm.  The [employer] robot was then docked to the trocars.  A tip up grasper in arm 4, vessel sealer in arm 3, camera in arm 2 and a fenestrated bipolar in arm 1.  At this time and went to the surgeon console while my assistant stayed at the bedside.  An assistant was needed to help with tissue retraction and manipulation given the complexity of the case.  The patient had a large type III hiatal hernia.  The GE junction and the proximal stomach was above the diaphragm.  Approximately one third of the stomach was above the  diaphragm.     I started my approach along the anterior diaphragm incising the peritoneum with the vessel sealer.  I was able to peel and get into the space of the hernia sac.  I continued this toward the right crus of the diaphragm.  We identified the plane between the hernia sac and the peritoneum along the right crus.  This was again taken down in sequential fashion using the vessel sealer.  Then with some gentle blunt dissection reduced some of  the right sided hernia sac.  Patient had a fair amount of perigastric periesophageal fat.  Some omentum was also reduced from the mediastinum as well.  I then turned my attention and started taking the hernia sac down along the left anterior diaphragm toward the left crus.  We then decided to mobilize and take down some of the short gastric vessels off the proximal greater curvature of the stomach.  These were taken down in sequential fashion all the way up to the cardia and to the left crus.  We then turned our attention posteriorly to the junction of the left and right crus posteriorly.  I was able then to pass a Penrose drain around the retroesophageal area.  My assistant held this as I continued and started circumferential mobilization of the proximal stomach and esophagus in the mediastinum.  The aorta was visualized.  I did see a right vagus nerve.  We did not confidently visualize a left vagus nerve.  The left anterior & lateral planes and posterior planes in the mediastinum were little bit more adhered or adhesed in this area.  However we were able to identify the esophagus easily but did take our time in this area since these planes were a little bit more adhered/sticky.  Some oozing in places taking care of with the vessel sealer.  We continue to circumstantial mobilization posteriorly, anteriorly, left and right around the esophagus in the mediastinum.  Again did not confidently visualize the left vagus but identified and preserve the right vagus.  Patient appeared to have a foreshortened esophagus.  We had achieved about 1 cm of intra-abdominal esophageal length.  This time I debrided some of the hernia sac around the GE junction with the vessel sealer ensuring that we were not on the stomach or on the esophagus.  We returned to the mediastinum and a look to see if there is any additional length that we could obtain.  I did not feel that by taking the right vagus that would give [place] significant additional  length.  I then obtained the Olympus endoscope and went to the head of the bed and placed the endoscope in the patient's oropharynx and gently glided it down.  There is no evidence of esophageal injury.  I was able to easily advance the endoscope into the stomach.  I then pulled back on the endoscope and visualized the Z-line.  We transilluminated.  The Z-line was approximately 0.5-1 cm below the diaphragm.  Again reinspected the distal and midesophagus and there is no evidence of esophageal injury.  The stomach was desufflated and the endoscope was withdrawn and removed.  I returned to the surgeon console.  After discussion with partner I did not feel that we could achieve any more significant intra-abdominal esophageal length by more proximal mobilization in the mediastinum.  We reduced intra-abdominal pressure to 10 mmHg.  I released a little bit of the right crus with the vessel sealer anteriorly.  I then reapproximated the left and right  crura with 4 interrupted 0 Ethibond sutures.  I reinforced the cruroplasty with Gore Bio-A hiatal hernia mesh.  Assistant introduced it and I placed it around the diaphragm closure.  It easily sat over the cruroplasty in the left and right edges of the mesh laid over the right and left diaphragm in good fashion.  The mesh was then anchored in 3 locations.  I placed a posterior suture between the mesh and the cruroplasty with 0 Ethibond.  I then anchored the right side of the mesh to the right side of the diaphragm with another interrupted 0 Ethibond.  In a similar fashion did a similar suture on the left side of the mesh to the left diaphragm.  I then placed two interrupted 0 Ethibond sutures along the cardia and fundus of the stomach to the diaphragm as a gastropexy suture.  The stomach looked to be laying in the correct orientation.  There were 2 superficial serosal tears in the fundus of the stomach which was not an unexpected occurrence.  They were each repaired with 3-0  Vicryl's in a Lembert fashion with 2 sutures in each location.  There is no evidence of twisting of the stomach after a gastropexy sutures.  Those were removed along with the excised hernia sac.  Liver retractor was removed.  No evidence of bowel injury.  There is no evidence of injury to the left lobe of the liver.  The robot was undocked.  I scrubbed back in.  Trocars removed and the skin incisions were closed with a 4 Monocryl followed by the application of Dermabond.  All needle, instrument, and sponge counts were correct x 2.  There were no immediate complications.  Patient tolerated suture well.  Patient remained in the operating room for the urologist portion of the procedure.  Please see their operative note for additional details  PLAN OF CARE: Admit for overnight observation   PATIENT DISPOSITION: OR- hemodynamically stable.   Delay start of Pharmacological VTE agent (>24hrs) due to surgical blood loss or risk of bleeding:  no  Mary Sella. Andrey Campanile, MD, FACS General, Bariatric, & Minimally Invasive Surgery [facility],  [facility]

## 2023-06-29 NOTE — H&P (Signed)
 CC/HPI: cc: Frequent UTIs, nocturia, suprapubic pain   04/29/22: 78 year old woman here to establish care for recurrent UTIs, nocturia and suprapubic pain. She was initially seen by her PCP as well as her GYN. She is going to have a pelvic ultrasound done next week for some right lower quadrant/pelvic pain. Patient notices that the pain is worse in the cold and improved with a heating pad. She also reports nocturia 5-6 times a night. She stops drinking at 7 PM and goes to bed around 9 PM. She drinks 2 glasses of wine a day every day. She is not really interested in trying a medication at this point. She is taking a cranberry pill. She has had at least 2 UTIs in the last 6 months.   Overactive bladder validated question screener: 2, 4, 0, 2, 5, 5, 0, 1=19/40   01/22/2023: 78 year old female who presents today with concerns of urinary frequency and urethral pain. This occurs mostly at night after drinking a few glasses of wine. She states she will have nocturia 4-5 times and occasionally have urgency in the morning where she leaks on the way to the bathroom. She does not wet a full pad. She tends to void about every 3 hours.   05/24/2023: 78 year old woman with a history of urinary frequency, nocturia and mixed urinary incontinence here for follow-up. Urodynamic studies listed below. She says the Leslye Peer is helping her but is very expensive.   UDS SUMMARY  Ms. Momon held a max capacity of approx. 270 mls. Her 1st sensation was felt at . There was positive SUI. She leaked with both coughing and Valsalva. Please information above. There was positive instability. There were a few provoked contractions noted on the study. She felt an increased urgency and leaked a severe amount. She was able to generate a voluntary contraction and void 53 ml with max flow of 4 ml/s. Max detrusor pressure while voiding was 23 cmH20. EMG leads were basically quiet during the voiding phase. PVR was approx. 27 mls. During cough  and Valsalva, the bladder descended approximately 0-1cm. No trabeculation noted. No reflux was seen. She will return for UDS follow up.     ALLERGIES: No Allergies    MEDICATIONS: Myrbetriq 50 mg tablet, extended release 24 hr 1 tablet PO Daily  Atorvastatin Calcium 20 mg tablet  Trazodone Hcl 50 mg tablet  Uribel Tabs 81.6 mg-10.8 mg-9 mg-36.2 mg-0.12 mg tablet 1 tablet PO TID PRN as needed for bladder spasms     GU PSH: Complex cystometrogram, w/ void pressure and urethral pressure profile studies, any technique - 05/20/2023 Complex Uroflow - 05/20/2023 Emg surf Electrd - 05/20/2023 Inject For cystogram - 05/20/2023 Intrabd voidng Press - 05/20/2023     NON-GU PSH: No Non-GU PSH    GU PMH: Urinary Urgency - 05/20/2023, - 01/22/2023, - 04/29/2022 Pelvic/perineal pain - 01/22/2023, - 04/29/2022 Chronic cystitis (w/o hematuria) - 04/29/2022 Nocturia - 04/29/2022    NON-GU PMH: Arthritis Hypercholesterolemia    FAMILY HISTORY: No Family History    SOCIAL HISTORY: Marital Status: Divorced Preferred Language: English; Ethnicity: Not Hispanic Or Latino; Race: White Current Smoking Status: Patient smokes. Has smoked since 04/06/1992. Smokes 1 pack per day.   Tobacco Use Assessment Completed: Used Tobacco in last 30 days? Social Drinker.  Drinks 1 caffeinated drink per day. Patient's occupation is/was Retired.    REVIEW OF SYSTEMS:    GU Review Female:   Patient denies frequent urination, hard to postpone urination, burning /pain with urination, get up  at night to urinate, leakage of urine, stream starts and stops, trouble starting your stream, have to strain to urinate, and being pregnant.  Gastrointestinal (Upper):   Patient denies nausea, vomiting, and indigestion/ heartburn.  Gastrointestinal (Lower):   Patient denies diarrhea and constipation.  Constitutional:   Patient denies fever, night sweats, weight loss, and fatigue.  Skin:   Patient denies skin rash/ lesion and itching.   Eyes:   Patient denies blurred vision and double vision.  Ears/ Nose/ Throat:   Patient denies sore throat and sinus problems.  Hematologic/Lymphatic:   Patient denies easy bruising and swollen glands.  Cardiovascular:   Patient denies leg swelling and chest pains.  Respiratory:   Patient denies cough and shortness of breath.  Endocrine:   Patient denies excessive thirst.  Musculoskeletal:   Patient denies back pain and joint pain.  Neurological:   Patient denies headaches and dizziness.  Psychologic:   Patient denies depression and anxiety.   VITAL SIGNS: None   MULTI-SYSTEM PHYSICAL EXAMINATION:    Constitutional: Well-nourished. No physical deformities. Normally developed. Good grooming.  Neck: Neck symmetrical, not swollen. Normal tracheal position.  Respiratory: No labored breathing, no use of accessory muscles.   Skin: No paleness, no jaundice, no cyanosis. No lesion, no ulcer, no rash.  Neurologic / Psychiatric: Oriented to time, oriented to place, oriented to person. No depression, no anxiety, no agitation.  Eyes: Normal conjunctivae. Normal eyelids.  Ears, Nose, Mouth, and Throat: Left ear no scars, no lesions, no masses. Right ear no scars, no lesions, no masses. Nose no scars, no lesions, no masses. Normal hearing. Normal lips.  Musculoskeletal: Normal gait and station of head and neck.     Complexity of Data:  Records Review:   Previous Patient Records, POC Tool  Urine Test Review:   Urinalysis  Urodynamics Review:   Review Urodynamics Tests   PROCEDURES:          Visit Complexity - G2211          Urinalysis Dipstick Dipstick Cont'd  Color: Yellow Bilirubin: Neg mg/dL  Appearance: Clear Ketones: Neg mg/dL  Specific Gravity: 2.130 Blood: Neg ery/uL  pH: <=5.0 Protein: Neg mg/dL  Glucose: Neg mg/dL Urobilinogen: 0.2 mg/dL    Nitrites: Neg    Leukocyte Esterase: Neg leu/uL    ASSESSMENT:      ICD-10 Details  1 GU:   Chronic cystitis (w/o hematuria) - N30.20  Chronic, Stable  2   Pelvic/perineal pain - R10.2 Chronic, Stable  3   Urinary Urgency - R39.15 Chronic, Stable  4   Mixed incontinence - N39.46 Chronic, Stable   PLAN:           Document Letter(s):  Created for Patient: Clinical Summary         Notes:   Mixed urinary incontinence:  -Will reach out to [facility] to see if she has the list of medications that are approved by her insurance company but gave more samples of Gemtesa today for patient's upcoming trip  -Patient may also benefit from a combination of Bulkamid and Botox in the OR. She would like to try and do this under sedation or at the same time as her hernia surgery in March  -Risks and benefits include but are not limited to pain, bleeding, infection, urinary retention, need for self-catheterization, damage to surrounding structures

## 2023-06-29 NOTE — Op Note (Addendum)
 Operative Note   Preoperative diagnosis:  1.  Mixed urinary incontinence   Postoperative diagnosis: 1.  Mixed urinary incontinence   Procedure(s): 1.  Cystoscopy with injection of bulkamid and botox 100units   Surgeon: Kasandra Knudsen, MD   Assistants:  None   Anesthesia:  General   Complications:  None   EBL:  minimal   Specimens: 1. none   Drains/Catheters: 1.  none   Intraoperative findings:   Normal urethra Bilateral orthotopic Uos Mild cystocele   Indication:  78 yo woman with symptomatic mixed urinary incontinence.   Description of procedure:   The patient was already under general anesthesia when urology was called.  Dr. Andrey Campanile had completed his portion of the case.  The 21Fr rigid cystoscope was placed in the urethral meatus and advanced into the bladder under direct visualization.  100 units of Botox and 10 cc of sterile saline were then injected in standard template over the posterior wall the bladder taking care to avoid the ureteral orifices.  Next, the cystoscope was assembled with the Bulkamid system.  It was then placed in the urethral meatus and advanced into the bladder under direct visualization.  Prior cystoscopy had been done which noted normal anatomic landmarks.  These were again verified during cystoscopy today.  The cystoscope was brought back to the bladder neck and the needle was advanced through the needle guide at the 1 o'clock position.  Once it was visualized and advanced it was rotated to the 5 o'clock position.  Bulkamid was then injected until bleb was seen.  This was then repeated at the 1 o'clock position in the 7 o'clock position until coaptation was noted.   This concluded the case.  The patient's bladder was left with approximately 200 cc of sterile saline.  The patient emerged from anesthesia and was transferred the PACU in stable condition.   Plan: The patient to be admitted with general surgery.  If she does not void recommend 14  French Foley catheter.

## 2023-06-29 NOTE — H&P (Signed)
 CC: here for surgery  Requesting provider: n/a  HPI: Jalonda R Sidener is an 78 y.o. female who is here for robotic repair of hiatal hernia with possible mesh, possible fundoplication and upper endoscopy.  She denies any changes since I last saw her.  She she will be undergoing concomitant cystoscopy and Botox injection with Dr. Arita Miss this morning as well.  OLD HPIs 03/2023 She denies any medical changes since I last saw her. She denies any early satiety. She denies any fatigue. She denies any reflux or heartburn. She denies any dysphagia. She denies any nausea or vomiting.  OLD HPI JULY 2024 She comes in for long-term follow-up regarding her known hiatal hernia. She has a history of anemia and was found to have Cameron ulcers in the setting of a hiatal hernia. She had a unremarkable colonoscopy except for some diverticulosis. She wanted to hold off on surgery because she was doing a lot of traveling during the summer. She is yet to complete her upper GI. After I saw her in April she states that she had a case of shingles and was given medication which caused severe diarrhea which prompted her to end up in the emergency room. She had a CT scan that showed perhaps some mild diverticulitis of the sigmoid colon but no other complicating features. She denies any heartburn, indigestion, dysphagia, sensation of anything getting stuck. No melena or hematochezia.  Still leaning toward surgery but at this point would like to wait until January because she has an additional amount of traveling to do over the next few months.  OLD HPI 07/2022 She is here to discuss a moderate hiatal hernia. She states that she was having dizziness, lightheadedness and low energy and was found to have a low hemoglobin around 7. This prompted a workup including an upper and lower endoscopy. She was found to have hiatal hernia with Sheria Lang ulcers. She has been started on oral iron. She states her hemoglobin is increased but she is  still having fatigue and low energy.  She denies any foregut symptoms. She denies any heartburn. She denies any reflux. She denies any sour taste. She denies any dysphagia. She denies any early satiety. No weight loss.  She states she was put on pantoprazole after her upper endoscopy.  She moved to the area about 3 years ago to be closer to her son. She shared with me that her son underwent umbilical hernia surgery and about 5 weeks after surgery he passed away and died unexpectedly. An autopsy revealed he had massive pulmonary embolism. She states that he was active after his umbilical hernia surgery and then his he was recovering he got the Moderna vaccine and about 2 weeks later is when he passed away. As a result she has no immediate family in the area. She has friends in the area.   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 No pertinent family history.  Social:  reports that she quit smoking about 39 years ago. Her smoking use included cigarettes. She has never used smokeless tobacco. She reports current alcohol use of about 2.0 standard drinks of alcohol per week. She reports that she does not use drugs.  Allergies:  Allergies  Allergen Reactions   Leflunomide Other (See Comments)     Unknown reaction     Medications: I have reviewed the patient's current medications.   ROS - all of the below systems have been reviewed with the patient and positives are indicated with bold text General: chills, fever or night sweats Eyes: blurry vision or double vision ENT: epistaxis or sore throat Allergy/Immunology: itchy/watery eyes or nasal congestion Hematologic/Lymphatic: bleeding problems, blood clots or swollen lymph nodes Endocrine: temperature intolerance or unexpected weight changes Breast: new or changing breast lumps or nipple discharge Resp: cough, shortness of breath, or wheezing CV: chest pain or dyspnea on exertion GI: as per HPI GU: dysuria, trouble voiding, or hematuria MSK: joint pain or joint stiffness Neuro: TIA or stroke symptoms Derm: pruritus and skin lesion changes Psych: anxiety and depression  PE Blood pressure (!) 159/82, pulse 61, temperature 97.6 F (36.4 C), temperature source Oral, resp. rate 17, height 5\' 8"  (1.727 m), weight 85.3 kg, SpO2 99%. Constitutional: NAD; conversant; no deformities Eyes: Moist conjunctiva; no lid lag; anicteric; PERRL Neck: Trachea midline; no thyromegaly Lungs: Normal respiratory effort; no tactile fremitus CV: RRR; no palpable thrills; no pitting edema GI: Abd soft, nt,; no palpable hepatosplenomegaly MSK: Normal gait; no clubbing/cyanosis Psychiatric: Appropriate affect; alert and oriented x3 Lymphatic: No palpable cervical or axillary lymphadenopathy Skin:no rash  Results for orders placed or performed during the hospital encounter of 06/29/23 (from the past 48 hours)  ABO/Rh     Status: None (Preliminary result)   Collection Time: 06/29/23  6:37 AM  Result Value Ref Range   ABO/RH(D) PENDING     No results found.  Imaging: reviewed  A/P: Jezreel R Devenport is an 78 y.o. female with  Hiatal hernia  H/o Cameron ulcer, unspecified ulcer chronicity   To operating room for robotic repair of hiatal  hernia with possible mesh, possible fundoplication upper endoscopy iv antibiotic on-call Enhanced recovery protocols Preoperative subcutaneous heparin We rediscussed the typical hospitalization and the typical recovery  We have met several times before to discuss hiatal hernia repair with possible mesh and possible fundoplication in great detail.  She had no additional questions this morning.   Mary Sella. Andrey Campanile, MD, FACS General, Bariatric, & Minimally Invasive Surgery [facility]

## 2023-06-30 ENCOUNTER — Encounter (HOSPITAL_COMMUNITY): Payer: Self-pay | Admitting: General Surgery

## 2023-06-30 ENCOUNTER — Ambulatory Visit (HOSPITAL_COMMUNITY)

## 2023-06-30 DIAGNOSIS — K449 Diaphragmatic hernia without obstruction or gangrene: Secondary | ICD-10-CM | POA: Diagnosis not present

## 2023-06-30 LAB — CBC
HCT: 36.9 % (ref 36.0–46.0)
Hemoglobin: 11.7 g/dL — ABNORMAL LOW (ref 12.0–15.0)
MCH: 31 pg (ref 26.0–34.0)
MCHC: 31.7 g/dL (ref 30.0–36.0)
MCV: 97.6 fL (ref 80.0–100.0)
Platelets: 228 10*3/uL (ref 150–400)
RBC: 3.78 MIL/uL — ABNORMAL LOW (ref 3.87–5.11)
RDW: 13.4 % (ref 11.5–15.5)
WBC: 6.4 10*3/uL (ref 4.0–10.5)
nRBC: 0 % (ref 0.0–0.2)

## 2023-06-30 LAB — BASIC METABOLIC PANEL
Anion gap: 7 (ref 5–15)
BUN: 13 mg/dL (ref 8–23)
CO2: 27 mmol/L (ref 22–32)
Calcium: 8.4 mg/dL — ABNORMAL LOW (ref 8.9–10.3)
Chloride: 100 mmol/L (ref 98–111)
Creatinine, Ser: 0.94 mg/dL (ref 0.44–1.00)
GFR, Estimated: >60 mL/min (ref >60)
Glucose, Bld: 212 mg/dL — ABNORMAL HIGH (ref 70–99)
Potassium: 5 mmol/L (ref 3.5–5.1)
Sodium: 134 mmol/L — ABNORMAL LOW (ref 135–145)

## 2023-06-30 LAB — MAGNESIUM: Magnesium: 2 mg/dL (ref 1.7–2.4)

## 2023-06-30 MED ORDER — ONDANSETRON HCL 4 MG PO TABS: 4.0000 mg | ORAL_TABLET | Freq: Every day | ORAL | 1 refills | Status: AC | PRN

## 2023-06-30 MED ORDER — IOHEXOL 300 MG/ML  SOLN
50.0000 mL | Freq: Once | INTRAMUSCULAR | Status: AC | PRN
  Administered 2023-06-30: 50 mL via ORAL

## 2023-06-30 MED ORDER — OXYCODONE HCL 5 MG PO TABS
5.0000 mg | ORAL_TABLET | Freq: Four times a day (QID) | ORAL | 0 refills | Status: AC | PRN
Start: 2023-06-30 — End: ?

## 2023-06-30 MED ORDER — ACETAMINOPHEN 500 MG PO TABS
1000.0000 mg | ORAL_TABLET | Freq: Three times a day (TID) | ORAL | Status: AC
Start: 2023-06-30 — End: 2023-07-05

## 2023-06-30 NOTE — Progress Notes (Signed)
 Mobility Specialist - Progress Note   06/30/23 1203  Mobility  Activity Ambulated independently in hallway;Ambulated independently to bathroom  Level of Assistance Independent  Assistive Device None  Distance Ambulated (ft) 600 ft  Activity Response Tolerated well  Mobility Referral Yes  Mobility visit 1 Mobility  Mobility Specialist Start Time (ACUTE ONLY) 1151  Mobility Specialist Stop Time (ACUTE ONLY) 1200  Mobility Specialist Time Calculation (min) (ACUTE ONLY) 9 min   Pt received in bed and agreeable to mobility. No complaints during session. Pt to bed after session with all needs met.    [facility]

## 2023-06-30 NOTE — Discharge Summary (Signed)
 Physician Discharge Summary  Natalie Bradshaw:[id number] DOB: Aug 22, 1945 DOA: 06/29/2023  PCP: Shon Hale, MD  Admit date: 06/29/2023 Discharge date: 06/30/23  Recommendations for Outpatient Follow-up:     Follow-up Information     [facility] Follow up on 07/14/2023.   Why: 9:390am Contact information: [address] [place] [address] [phone]        Gaynelle Adu, MD. Go on 07/27/2023.   Specialty: General Surgery Why: arrive at 3:30 PM, For wound re-check Contact information: [address] [place] [address] [phone]                Discharge Diagnoses:  Large type III hiatal hernia status postrepair Stress urinary incontinence  Surgical Procedure: Cystoscopy with injection of bulking might and Botox 100 units by Dr. Arita Miss  Robotic assisted repair of type III hiatal hernia with mesh and gastropexy, upper endoscopy-Dr. Andrey Campanile  Discharge Condition: good Disposition: home  Diet recommendation: full liquid diet  Filed Weights   06/29/23 0626  Weight: 85.3 kg    History of present illness:  Patient came in for planned robotic assisted repair of a large type III hiatal hernia with a history of Cameron erosions.  She also had history of mixed urinary incontinence and requested a concomitant procedure with urology to manage that condition.  Hospital Course:  Kept overnight for observation.  She was started on clear liquids on postop day 0.  She was maintained on perioperative chemical DVT prophylaxis.  On postoperative day 1 she was doing well.  She was tolerating clear liquids.  She had no fever or tachycardia.  She underwent an upper GI which demonstrated no leak.  She was advanced to full liquids which she tolerated.  She had voided without issues during surgery.  We have discussed discharge instructions and she was felt stable for discharge and patient requested discharge to home  BP 118/66  (BP Location: Left Arm)   Pulse 70   Temp 98.6 F (37 C) (Oral)   Resp 18   Ht 5\' 8"  (1.727 m)   Wt 85.3 kg   SpO2 99%   BMI 28.59 kg/m   Gen: alert, NAD, non-toxic appearing Pupils: equal, no scleral icterus Pulm: Lungs clear to auscultation, symmetric chest rise CV: regular rate and rhythm Abd: soft, min tender, nondistended. . No cellulitis. No incisional hernia Ext: no edema, no calf tenderness Skin: no rash, no jaundice    Discharge Instructions  Discharge Instructions     Call MD for:   Complete by: As directed    Temperature >101   Call MD for:  hives   Complete by: As directed    Call MD for:  persistant dizziness or light-headedness   Complete by: As directed    Call MD for:  persistant nausea and vomiting   Complete by: As directed    Call MD for:  redness, tenderness, or signs of infection (pain, swelling, redness, odor or green/yellow discharge around incision site)   Complete by: As directed    Call MD for:  severe uncontrolled pain   Complete by: As directed    Diet full liquid   Complete by: As directed    See discharge instructions   Discharge instructions   Complete by: As directed    See [facility] discharge instructions   Increase activity slowly   Complete by: As directed       Allergies as of 06/30/2023  Reactions   Leflunomide Other (See Comments)   Unknown reaction         Medication List     TAKE these medications    acetaminophen 500 MG tablet Commonly known as: TYLENOL Take 2 tablets (1,000 mg total) by mouth every 8 (eight) hours for 5 days.   atorvastatin 20 MG tablet Commonly known as: LIPITOR Take 20 mg by mouth daily.   augmented betamethasone dipropionate 0.05 % cream Commonly known as: DIPROLENE-AF Apply 1 Application topically 2 (two) times daily as needed (redness).   Gemtesa 75 MG Tabs Generic drug: Vibegron Take 75 mg by mouth daily.   Olumiant tablet Generic drug: baricitinib Take 2 mg by mouth  daily.   ondansetron 4 MG tablet Commonly known as: Zofran Take 1 tablet (4 mg total) by mouth daily as needed for nausea or vomiting.   oxyCODONE 5 MG immediate release tablet Commonly known as: Oxy IR/ROXICODONE Take 1 tablet (5 mg total) by mouth every 6 (six) hours as needed for severe pain (pain score 7-10).   pantoprazole 40 MG tablet Commonly known as: PROTONIX Take 40 mg by mouth daily.   traZODone 50 MG tablet Commonly known as: DESYREL Take 100 mg by mouth at bedtime.   tretinoin 0.1 % cream Commonly known as: RETIN-A APPLY TO AFFECTED AREA EVERY DAY AT BEDTIME        Follow-up Information     [facility] Follow up on 07/14/2023.   Why: 9:390am Contact information: [address] [place] [address] [phone]        Gaynelle Adu, MD. Go on 07/27/2023.   Specialty: General Surgery Why: arrive at 3:30 PM, For wound re-check Contact information: [address] [place] [address] [phone]                  The results of significant diagnostics from this hospitalization (including imaging, microbiology, ancillary and laboratory) are listed below for reference.    Significant Diagnostic Studies: DG UGI W SINGLE CM (SOL OR THIN BA) Result Date: 06/30/2023 CLINICAL DATA:  Postop hiatal hernia repair. EXAM: WATER SOLUBLE UPPER GI SERIES TECHNIQUE: Single-column upper GI series was performed using water soluble contrast. Radiation Exposure Index (as provided by the fluoroscopic device): 23.2 mGy Kerma COMPARISON:  None Available. FLUOROSCOPY: Fluoroscopy Time:  0 minutes 48 seconds Radiation Exposure Index (if provided by the fluoroscopic device): Number of Acquired Spot Images: 1 FINDINGS: Scout view the abdomen shows a fair amount of stool in the colon. Patient drank water-soluble contrast. No leak. Hiatal hernia repair. Some retention of contrast in the esophagus. IMPRESSION: No leak status post  hiatal hernia repair. Electronically Signed   By: Leanna Battles M.D.   On: 06/30/2023 10:22    Microbiology: No results found for this or any previous visit (from the past 240 hours).   Labs: Basic Metabolic Panel: Recent Labs  Lab 06/30/23 0519  NA 134*  K 5.0  CL 100  CO2 27  GLUCOSE 212*  BUN 13  CREATININE 0.94  CALCIUM 8.4*  MG 2.0   Liver Function Tests: No results for input(s): "AST", "ALT", "ALKPHOS", "BILITOT", "PROT", "ALBUMIN" in the last 168 hours. No results for input(s): "LIPASE", "AMYLASE" in the last 168 hours. No results for input(s): "AMMONIA" in the last 168 hours. CBC: Recent Labs  Lab 06/30/23 0519  WBC 6.4  HGB 11.7*  HCT 36.9  MCV 97.6  PLT 228   Cardiac Enzymes:  No results for input(s): "CKTOTAL", "CKMB", "CKMBINDEX", "TROPONINI" in the last 168 hours. BNP: BNP (last 3 results) No results for input(s): "BNP" in the last 8760 hours.  ProBNP (last 3 results) No results for input(s): "PROBNP" in the last 8760 hours.  CBG: No results for input(s): "GLUCAP" in the last 168 hours.  Principal Problem:   History of repair of hiatal hernia   Time coordinating discharge: 20 min  Signed:  Atilano Ina, MD [facility] [phone] 06/30/2023, 12:12 PM

## 2023-06-30 NOTE — Plan of Care (Signed)

## 2023-06-30 NOTE — Progress Notes (Signed)
Pt was discharged home today. Instructions were reviewed with patient, and questions were answered. Pt was taken to main entrance via wheelchair by NT.  

## 2023-07-14 DIAGNOSIS — N3946 Mixed incontinence: Secondary | ICD-10-CM | POA: Diagnosis not present

## 2023-07-21 DIAGNOSIS — M0609 Rheumatoid arthritis without rheumatoid factor, multiple sites: Secondary | ICD-10-CM | POA: Diagnosis not present

## 2023-07-27 DIAGNOSIS — Z9889 Other specified postprocedural states: Secondary | ICD-10-CM | POA: Diagnosis not present

## 2023-07-27 DIAGNOSIS — Z8719 Personal history of other diseases of the digestive system: Secondary | ICD-10-CM | POA: Diagnosis not present

## 2023-08-09 DIAGNOSIS — M461 Sacroiliitis, not elsewhere classified: Secondary | ICD-10-CM | POA: Diagnosis not present

## 2023-08-18 DIAGNOSIS — L719 Rosacea, unspecified: Secondary | ICD-10-CM | POA: Diagnosis not present

## 2023-08-23 DIAGNOSIS — N3020 Other chronic cystitis without hematuria: Secondary | ICD-10-CM | POA: Diagnosis not present

## 2023-09-16 DIAGNOSIS — R351 Nocturia: Secondary | ICD-10-CM | POA: Diagnosis not present

## 2023-09-16 DIAGNOSIS — N3946 Mixed incontinence: Secondary | ICD-10-CM | POA: Diagnosis not present

## 2023-09-16 DIAGNOSIS — N3020 Other chronic cystitis without hematuria: Secondary | ICD-10-CM | POA: Diagnosis not present

## 2023-10-04 DIAGNOSIS — N3020 Other chronic cystitis without hematuria: Secondary | ICD-10-CM | POA: Diagnosis not present

## 2023-10-15 DIAGNOSIS — D485 Neoplasm of uncertain behavior of skin: Secondary | ICD-10-CM | POA: Diagnosis not present

## 2023-10-15 DIAGNOSIS — Z85828 Personal history of other malignant neoplasm of skin: Secondary | ICD-10-CM | POA: Diagnosis not present

## 2023-10-15 DIAGNOSIS — L2389 Allergic contact dermatitis due to other agents: Secondary | ICD-10-CM | POA: Diagnosis not present

## 2023-10-15 DIAGNOSIS — D225 Melanocytic nevi of trunk: Secondary | ICD-10-CM | POA: Diagnosis not present

## 2023-10-15 DIAGNOSIS — Z86018 Personal history of other benign neoplasm: Secondary | ICD-10-CM | POA: Diagnosis not present

## 2023-10-15 DIAGNOSIS — L578 Other skin changes due to chronic exposure to nonionizing radiation: Secondary | ICD-10-CM | POA: Diagnosis not present

## 2023-10-15 DIAGNOSIS — D1801 Hemangioma of skin and subcutaneous tissue: Secondary | ICD-10-CM | POA: Diagnosis not present

## 2023-10-15 DIAGNOSIS — L570 Actinic keratosis: Secondary | ICD-10-CM | POA: Diagnosis not present

## 2023-10-15 DIAGNOSIS — L089 Local infection of the skin and subcutaneous tissue, unspecified: Secondary | ICD-10-CM | POA: Diagnosis not present

## 2023-10-15 DIAGNOSIS — L719 Rosacea, unspecified: Secondary | ICD-10-CM | POA: Diagnosis not present

## 2023-10-15 DIAGNOSIS — D229 Melanocytic nevi, unspecified: Secondary | ICD-10-CM | POA: Diagnosis not present

## 2023-10-15 DIAGNOSIS — L821 Other seborrheic keratosis: Secondary | ICD-10-CM | POA: Diagnosis not present

## 2023-10-20 DIAGNOSIS — E663 Overweight: Secondary | ICD-10-CM | POA: Diagnosis not present

## 2023-10-20 DIAGNOSIS — M1991 Primary osteoarthritis, unspecified site: Secondary | ICD-10-CM | POA: Diagnosis not present

## 2023-10-20 DIAGNOSIS — Z6829 Body mass index (BMI) 29.0-29.9, adult: Secondary | ICD-10-CM | POA: Diagnosis not present

## 2023-10-20 DIAGNOSIS — Z79899 Other long term (current) drug therapy: Secondary | ICD-10-CM | POA: Diagnosis not present

## 2023-10-20 DIAGNOSIS — M0609 Rheumatoid arthritis without rheumatoid factor, multiple sites: Secondary | ICD-10-CM | POA: Diagnosis not present

## 2023-11-04 DIAGNOSIS — E1165 Type 2 diabetes mellitus with hyperglycemia: Secondary | ICD-10-CM | POA: Diagnosis not present

## 2023-11-04 DIAGNOSIS — E782 Mixed hyperlipidemia: Secondary | ICD-10-CM | POA: Diagnosis not present

## 2023-11-04 DIAGNOSIS — M0579 Rheumatoid arthritis with rheumatoid factor of multiple sites without organ or systems involvement: Secondary | ICD-10-CM | POA: Diagnosis not present

## 2023-11-19 DIAGNOSIS — R8271 Bacteriuria: Secondary | ICD-10-CM | POA: Diagnosis not present

## 2023-11-19 DIAGNOSIS — N3020 Other chronic cystitis without hematuria: Secondary | ICD-10-CM | POA: Diagnosis not present

## 2023-11-19 DIAGNOSIS — R102 Pelvic and perineal pain: Secondary | ICD-10-CM | POA: Diagnosis not present

## 2023-11-19 DIAGNOSIS — N3946 Mixed incontinence: Secondary | ICD-10-CM | POA: Diagnosis not present

## 2023-11-19 DIAGNOSIS — N3000 Acute cystitis without hematuria: Secondary | ICD-10-CM | POA: Diagnosis not present

## 2024-01-04 DIAGNOSIS — E1165 Type 2 diabetes mellitus with hyperglycemia: Secondary | ICD-10-CM | POA: Diagnosis not present

## 2024-01-04 DIAGNOSIS — E782 Mixed hyperlipidemia: Secondary | ICD-10-CM | POA: Diagnosis not present

## 2024-01-04 DIAGNOSIS — M0579 Rheumatoid arthritis with rheumatoid factor of multiple sites without organ or systems involvement: Secondary | ICD-10-CM | POA: Diagnosis not present

## 2024-01-19 DIAGNOSIS — M0609 Rheumatoid arthritis without rheumatoid factor, multiple sites: Secondary | ICD-10-CM | POA: Diagnosis not present

## 2024-01-24 DIAGNOSIS — H40012 Open angle with borderline findings, low risk, left eye: Secondary | ICD-10-CM | POA: Diagnosis not present

## 2024-01-24 DIAGNOSIS — E119 Type 2 diabetes mellitus without complications: Secondary | ICD-10-CM | POA: Diagnosis not present

## 2024-02-07 DIAGNOSIS — Z7251 High risk heterosexual behavior: Secondary | ICD-10-CM | POA: Diagnosis not present

## 2024-02-07 DIAGNOSIS — Z01419 Encounter for gynecological examination (general) (routine) without abnormal findings: Secondary | ICD-10-CM | POA: Diagnosis not present

## 2024-02-08 DIAGNOSIS — Z780 Asymptomatic menopausal state: Secondary | ICD-10-CM | POA: Diagnosis not present

## 2024-02-15 DIAGNOSIS — C44311 Basal cell carcinoma of skin of nose: Secondary | ICD-10-CM | POA: Diagnosis not present

## 2024-02-17 DIAGNOSIS — N3021 Other chronic cystitis with hematuria: Secondary | ICD-10-CM | POA: Diagnosis not present

## 2024-02-17 DIAGNOSIS — N3946 Mixed incontinence: Secondary | ICD-10-CM | POA: Diagnosis not present

## 2024-02-17 DIAGNOSIS — R399 Unspecified symptoms and signs involving the genitourinary system: Secondary | ICD-10-CM | POA: Diagnosis not present

## 2024-02-18 DIAGNOSIS — M461 Sacroiliitis, not elsewhere classified: Secondary | ICD-10-CM | POA: Diagnosis not present

## 2024-02-28 DIAGNOSIS — R350 Frequency of micturition: Secondary | ICD-10-CM | POA: Diagnosis not present

## 2024-02-28 DIAGNOSIS — N3021 Other chronic cystitis with hematuria: Secondary | ICD-10-CM | POA: Diagnosis not present

## 2024-03-16 DIAGNOSIS — N390 Urinary tract infection, site not specified: Secondary | ICD-10-CM | POA: Diagnosis not present
